# Patient Record
Sex: Male | Born: 1990 | Race: Black or African American | Hispanic: No
Health system: Southern US, Community
[De-identification: ages and names within clinical notes are randomized; demographics above are authoritative.]

## PROBLEM LIST (undated history)

## (undated) DIAGNOSIS — I10 Essential (primary) hypertension: Secondary | ICD-10-CM

## (undated) HISTORY — PX: NO PAST SURGERIES: SHX2092

## (undated) HISTORY — DX: Essential (primary) hypertension: I10

---
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
## 2008-07-31 ENCOUNTER — Emergency Department: Payer: Self-pay | Admitting: Emergency Medicine

## 2012-08-16 ENCOUNTER — Emergency Department: Payer: Self-pay | Admitting: Emergency Medicine

## 2013-08-02 ENCOUNTER — Emergency Department: Payer: Self-pay | Admitting: Emergency Medicine

## 2013-08-02 LAB — COMPREHENSIVE METABOLIC PANEL
Albumin: 3.7 g/dL (ref 3.4–5.0)
Alkaline Phosphatase: 92 U/L (ref 50–136)
BUN: 5 mg/dL — ABNORMAL LOW (ref 7–18)
Bilirubin,Total: 0.3 mg/dL (ref 0.2–1.0)
Calcium, Total: 8.4 mg/dL — ABNORMAL LOW (ref 8.5–10.1)
Chloride: 110 mmol/L — ABNORMAL HIGH (ref 98–107)
Co2: 27 mmol/L (ref 21–32)
Creatinine: 0.75 mg/dL (ref 0.60–1.30)
EGFR (Non-African Amer.): >60
Osmolality: 286 (ref 275–301)
Sodium: 145 mmol/L (ref 136–145)
Total Protein: 7.7 g/dL (ref 6.4–8.2)

## 2013-08-02 LAB — CBC
MCH: 28.6 pg (ref 26.0–34.0)
MCV: 84 fL (ref 80–100)
Platelet: 270 10*3/uL (ref 150–440)
WBC: 9.1 10*3/uL (ref 3.8–10.6)

## 2013-08-02 LAB — URINALYSIS, COMPLETE
Blood: NEGATIVE
Glucose,UR: NEGATIVE mg/dL (ref 0–75)
Ketone: NEGATIVE
Leukocyte Esterase: NEGATIVE
Nitrite: NEGATIVE
Protein: NEGATIVE
Specific Gravity: 1.005 (ref 1.003–1.030)
Squamous Epithelial: <1
WBC UR: NONE SEEN /HPF (ref 0–5)

## 2013-08-02 LAB — DRUG SCREEN, URINE
Barbiturates, Ur Screen: NEGATIVE (ref ?–200)
Cannabinoid 50 Ng, Ur ~~LOC~~: POSITIVE (ref ?–50)
Cocaine Metabolite,Ur ~~LOC~~: NEGATIVE (ref ?–300)
MDMA (Ecstasy)Ur Screen: NEGATIVE (ref ?–500)
Methadone, Ur Screen: NEGATIVE (ref ?–300)
Tricyclic, Ur Screen: NEGATIVE (ref ?–1000)

## 2013-08-02 LAB — ETHANOL: Ethanol %: 0.317 % (ref 0.000–0.080)

## 2013-12-14 ENCOUNTER — Emergency Department: Payer: Self-pay | Admitting: Emergency Medicine

## 2013-12-14 LAB — BASIC METABOLIC PANEL
Anion Gap: 5 — ABNORMAL LOW (ref 7–16)
Co2: 31 mmol/L (ref 21–32)
Creatinine: 0.84 mg/dL (ref 0.60–1.30)
EGFR (African American): >60
EGFR (Non-African Amer.): >60
Glucose: 93 mg/dL (ref 65–99)
Osmolality: 280 (ref 275–301)

## 2013-12-14 LAB — CBC
HCT: 48.3 % (ref 40.0–52.0)
HGB: 15.5 g/dL (ref 13.0–18.0)
MCH: 26.8 pg (ref 26.0–34.0)
RBC: 5.78 10*6/uL (ref 4.40–5.90)
RDW: 14.6 % — ABNORMAL HIGH (ref 11.5–14.5)

## 2015-12-29 ENCOUNTER — Encounter (HOSPITAL_COMMUNITY): Payer: Self-pay | Admitting: Emergency Medicine

## 2015-12-29 ENCOUNTER — Emergency Department (HOSPITAL_COMMUNITY)
Admission: EM | Admit: 2015-12-29 | Discharge: 2015-12-30 | Disposition: A | Discharge status: Home / Self Care | Payer: Self-pay | Attending: Emergency Medicine | Admitting: Emergency Medicine

## 2015-12-29 ENCOUNTER — Emergency Department (HOSPITAL_COMMUNITY): Payer: Self-pay

## 2015-12-29 DIAGNOSIS — S02402B Zygomatic fracture, unspecified, initial encounter for open fracture: Secondary | ICD-10-CM | POA: Insufficient documentation

## 2015-12-29 DIAGNOSIS — F17210 Nicotine dependence, cigarettes, uncomplicated: Secondary | ICD-10-CM | POA: Insufficient documentation

## 2015-12-29 DIAGNOSIS — S61411A Laceration without foreign body of right hand, initial encounter: Secondary | ICD-10-CM | POA: Insufficient documentation

## 2015-12-29 DIAGNOSIS — S0181XA Laceration without foreign body of other part of head, initial encounter: Secondary | ICD-10-CM

## 2015-12-29 DIAGNOSIS — Y9289 Other specified places as the place of occurrence of the external cause: Secondary | ICD-10-CM | POA: Insufficient documentation

## 2015-12-29 DIAGNOSIS — S01411A Laceration without foreign body of right cheek and temporomandibular area, initial encounter: Secondary | ICD-10-CM | POA: Insufficient documentation

## 2015-12-29 DIAGNOSIS — Y9389 Activity, other specified: Secondary | ICD-10-CM | POA: Insufficient documentation

## 2015-12-29 DIAGNOSIS — Z23 Encounter for immunization: Secondary | ICD-10-CM | POA: Insufficient documentation

## 2015-12-29 DIAGNOSIS — F1210 Cannabis abuse, uncomplicated: Secondary | ICD-10-CM | POA: Insufficient documentation

## 2015-12-29 DIAGNOSIS — Y998 Other external cause status: Secondary | ICD-10-CM | POA: Insufficient documentation

## 2015-12-29 LAB — RAPID URINE DRUG SCREEN, HOSP PERFORMED
AMPHETAMINES: NOT DETECTED
BARBITURATES: NOT DETECTED
BENZODIAZEPINES: NOT DETECTED
COCAINE: NOT DETECTED
OPIATES: NOT DETECTED
TETRAHYDROCANNABINOL: POSITIVE — AB

## 2015-12-29 MED ORDER — CEPHALEXIN 500 MG PO CAPS: 500.0000 mg | ORAL_CAPSULE | Freq: Four times a day (QID) | ORAL | Status: DC

## 2015-12-29 MED ORDER — TETANUS-DIPHTH-ACELL PERTUSSIS 5-2.5-18.5 LF-MCG/0.5 IM SUSP
0.5000 mL | Freq: Once | INTRAMUSCULAR | Status: AC
  Administered 2015-12-29: 0.5 mL via INTRAMUSCULAR
  Filled 2015-12-29: qty 0.5

## 2015-12-29 MED ORDER — AMOXICILLIN-POT CLAVULANATE 875-125 MG PO TABS: 1.0000 | ORAL_TABLET | Freq: Two times a day (BID) | ORAL | Status: DC

## 2015-12-29 MED ORDER — HYDROCODONE-ACETAMINOPHEN 5-325 MG PO TABS: 1.0000 | ORAL_TABLET | ORAL | Status: DC | PRN

## 2015-12-29 MED ORDER — OXYCODONE-ACETAMINOPHEN 5-325 MG PO TABS
2.0000 | ORAL_TABLET | Freq: Once | ORAL | Status: AC
  Administered 2015-12-29: 2 via ORAL
  Filled 2015-12-29: qty 2

## 2015-12-29 MED ORDER — LIDOCAINE HCL (PF) 1 % IJ SOLN
30.0000 mL | Freq: Once | INTRAMUSCULAR | Status: AC
  Administered 2015-12-29: 30 mL
  Filled 2015-12-29: qty 30
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
## 2015-12-29 NOTE — ED Notes (Signed)
Pt anxious. Pt continues to come out of room and stop every member of staff that passes by. Pt informed on several occasions to to stay in room and have a seat on the bed.

## 2016-01-01 ENCOUNTER — Ambulatory Visit (INDEPENDENT_AMBULATORY_CARE_PROVIDER_SITE_OTHER): Payer: Self-pay | Admitting: Otolaryngology

## 2016-05-26 ENCOUNTER — Other Ambulatory Visit
Admission: RE | Admit: 2016-05-26 | Discharge: 2016-05-26 | Disposition: A | Discharge status: Home / Self Care | Payer: Self-pay | Attending: Family Medicine | Admitting: Family Medicine
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
## 2020-03-03 DIAGNOSIS — F1010 Alcohol abuse, uncomplicated: Secondary | ICD-10-CM

## 2020-03-03 HISTORY — DX: Alcohol abuse, uncomplicated: F10.10

## 2020-04-25 ENCOUNTER — Emergency Department
Admission: EM | Admit: 2020-04-25 | Discharge: 2020-04-25 | Disposition: A | Discharge status: Home / Self Care | Payer: Self-pay | Attending: Emergency Medicine | Admitting: Emergency Medicine

## 2020-04-25 ENCOUNTER — Encounter: Payer: Self-pay | Admitting: Emergency Medicine

## 2020-04-25 ENCOUNTER — Other Ambulatory Visit: Payer: Self-pay

## 2020-04-25 DIAGNOSIS — K047 Periapical abscess without sinus: Secondary | ICD-10-CM | POA: Insufficient documentation

## 2020-04-25 DIAGNOSIS — F17210 Nicotine dependence, cigarettes, uncomplicated: Secondary | ICD-10-CM | POA: Insufficient documentation

## 2020-04-25 MED ORDER — MAGIC MOUTHWASH W/LIDOCAINE: 5.0000 mL | Freq: Four times a day (QID) | ORAL | 0 refills | Status: DC

## 2020-04-25 MED ORDER — HYDROCODONE-ACETAMINOPHEN 5-325 MG PO TABS
1.0000 | ORAL_TABLET | Freq: Once | ORAL | Status: AC
  Administered 2020-04-25: 1 via ORAL
  Filled 2020-04-25: qty 1

## 2020-04-25 MED ORDER — AMOXICILLIN 500 MG PO CAPS
1000.0000 mg | ORAL_CAPSULE | Freq: Once | ORAL | Status: AC
  Administered 2020-04-25: 1000 mg via ORAL
  Filled 2020-04-25: qty 2

## 2020-04-25 MED ORDER — IBUPROFEN 800 MG PO TABS: 800.0000 mg | ORAL_TABLET | Freq: Three times a day (TID) | ORAL | 0 refills | Status: DC | PRN

## 2020-04-25 MED ORDER — AMOXICILLIN 875 MG PO TABS: 875.0000 mg | ORAL_TABLET | Freq: Two times a day (BID) | ORAL | 0 refills | Status: DC
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
## 2020-04-25 NOTE — ED Provider Notes (Signed)
[facility] Emergency Department Provider Note  ____________________________________________  Time seen: Approximately 10:30 AM  I have reviewed the triage vital signs and the nursing notes.   HISTORY  Chief Complaint Dental Problem    HPI Wesley Doyle is a 29 y.o. male who presents the emergency department complaining of left-sided dental pain.  Patient is having pain to the left upper and lower dentition.  He believes the pain started in the left upper dentition however.  No fevers or chills.  No difficulty breathing or swallowing.  He does not have a dentist.  Patient denies any other complaints at this time.  No alleviating measures prior to arrival.         History reviewed. No pertinent past medical history.  There are no problems to display for this patient.   History reviewed. No pertinent surgical history.  Prior to Admission medications   Not on File    Allergies Patient has no known allergies.  History reviewed. No pertinent family history.  Social History Social History   Tobacco Use  . Smoking status: Current Every Day Smoker    Packs/day: 0.50    Years: 13.00    Pack years: 6.50    Types: Cigarettes  Substance Use Topics  . Alcohol use: Yes    Alcohol/week: 10.0 standard drinks    Types: 10 Shots of liquor per week    Comment: occasuionally  . Drug use: Yes    Types: Marijuana     Review of Systems  Constitutional: No fever/chills Eyes: No visual changes. No discharge ENT: Positive for left-sided dental pain Cardiovascular: no chest pain. Respiratory: no cough. No SOB. Gastrointestinal: No abdominal pain.  No nausea, no vomiting.  No diarrhea.  No constipation. Musculoskeletal: Negative for musculoskeletal pain. Skin: Negative for rash, abrasions, lacerations, ecchymosis. Neurological: Negative for headaches, focal weakness or numbness. 10-point ROS otherwise  negative.  ____________________________________________   PHYSICAL EXAM:  VITAL SIGNS: ED Triage Vitals  Enc Vitals Group     BP 04/25/20 1011 (!) 157/93     Pulse Rate 04/25/20 1011 70     Resp 04/25/20 1011 14     Temp 04/25/20 1011 98.1 F (36.7 C)     Temp Source 04/25/20 1011 Oral     SpO2 04/25/20 1011 99 %     Weight 04/25/20 1003 250 lb (113.4 kg)     Height 04/25/20 1003 5\' 8"  (1.727 m)     Head Circumference --      Peak Flow --      Pain Score 04/25/20 1003 10     Pain Loc --      Pain Edu? --      Excl. in [place]? --      Constitutional: Alert and oriented. Well appearing and in no acute distress. Eyes: Conjunctivae are normal. PERRL. EOMI. Head: Atraumatic. ENT:      Ears:       Nose: No congestion/rhinnorhea.      Mouth/Throat: Mucous membranes are moist.  A few scattered caries, dental erosion to the gumline left upper dentition with surrounding erythema and edema.  No purulent drainage.  No fluctuance with palpation of tongue depressor.  No gross swelling of the cheek.  Palpation reveals no tenderness in the submandibular region.  No erythema or edema of the anterior neck. Neck: No stridor.   Hematological/Lymphatic/Immunilogical: No cervical lymphadenopathy. Cardiovascular: Normal rate, regular rhythm. Normal S1 and S2.  Good peripheral circulation. Respiratory: Normal respiratory effort without  tachypnea or retractions. Lungs CTAB. Good air entry to the bases with no decreased or absent breath sounds. Musculoskeletal: Full range of motion to all extremities. No gross deformities appreciated. Neurologic:  Normal speech and language. No gross focal neurologic deficits are appreciated.  Skin:  Skin is warm, dry and intact. No rash noted. Psychiatric: Mood and affect are normal. Speech and behavior are normal. Patient exhibits appropriate insight and judgement.   ____________________________________________   LABS (all labs ordered are listed, but only  abnormal results are displayed)  Labs Reviewed - No data to display ____________________________________________  EKG   ____________________________________________  RADIOLOGY   No results found.  ____________________________________________    PROCEDURES  Procedure(s) performed:    Procedures    Medications  amoxicillin (AMOXIL) capsule 1,000 mg (has no administration in time range)  HYDROcodone-acetaminophen (NORCO/VICODIN) 5-325 MG per tablet 1 tablet (has no administration in time range)     ____________________________________________   INITIAL IMPRESSION / ASSESSMENT AND PLAN / ED COURSE  Pertinent labs & imaging results that were available during my care of the patient were reviewed by me and considered in my medical decision making (see chart for details).  Review of the [place] CSRS was performed in accordance of the [facility] prior to dispensing any controlled drugs.           Patient's diagnosis is consistent with dental infection.  Patient presented to the emergency department complaining of left-sided dental pain.  Patient has scattered caries identified but does have a tooth that is eroded to the gumline with surrounding erythema and edema.  No evidence of appreciable abscess requiring incision and drainage.  No indication for labs or imaging.. Patient will be discharged home with prescriptions for amoxicillin, Magic mouthwash, 800 mg ibuprofen. Patient is to follow up with primary care or dentist as needed or otherwise directed. Patient is given ED precautions to return to the ED for any worsening or new symptoms.     ____________________________________________  FINAL CLINICAL IMPRESSION(S) / ED DIAGNOSES  Final diagnoses:  Dental infection      NEW MEDICATIONS STARTED DURING THIS VISIT:  ED Discharge Orders    None          This chart was dictated using voice recognition software/Dragon. Despite best efforts to proofread, errors can  occur which can change the meaning. Any change was purely unintentional.    Racheal Patches, PA-C 04/25/20 1036    Minna Antis, MD 04/25/20 1445

## 2020-04-25 NOTE — ED Triage Notes (Signed)
Pt here for left upper dental pain. Does not have a dentist. No fever or drainage. Mild swelling.

## 2020-04-30 ENCOUNTER — Ambulatory Visit: Payer: Self-pay | Admitting: Gerontology

## 2020-04-30 ENCOUNTER — Other Ambulatory Visit: Payer: Self-pay

## 2020-04-30 ENCOUNTER — Encounter: Payer: Self-pay | Admitting: Gerontology

## 2020-04-30 VITALS — BP 150/97 | HR 69 | Temp 97.5°F | Ht 68.0 in | Wt 249.0 lb

## 2020-04-30 DIAGNOSIS — I10 Essential (primary) hypertension: Secondary | ICD-10-CM | POA: Insufficient documentation

## 2020-04-30 DIAGNOSIS — K0889 Other specified disorders of teeth and supporting structures: Secondary | ICD-10-CM | POA: Insufficient documentation

## 2020-04-30 DIAGNOSIS — Z7689 Persons encountering health services in other specified circumstances: Secondary | ICD-10-CM | POA: Insufficient documentation

## 2020-04-30 MED ORDER — CHLORTHALIDONE 25 MG PO TABS: 12.5000 mg | ORAL_TABLET | Freq: Every day | ORAL | 0 refills | Status: DC

## 2020-04-30 NOTE — Patient Instructions (Signed)
   Managing Your Hypertension Hypertension is commonly called high blood pressure. This is when the force of your blood pressing against the walls of your arteries is too strong. Arteries are blood vessels that carry blood from your heart throughout your body. Hypertension forces the heart to work harder to pump blood, and may cause the arteries to become narrow or stiff. Having untreated or uncontrolled hypertension can cause heart attack, stroke, kidney disease, and other problems. What are blood pressure readings? A blood pressure reading consists of a higher number over a lower number. Ideally, your blood pressure should be below 120/80. The first ("top") number is called the systolic pressure. It is a measure of the pressure in your arteries as your heart beats. The second ("bottom") number is called the diastolic pressure. It is a measure of the pressure in your arteries as the heart relaxes. What does my blood pressure reading mean? Blood pressure is classified into four stages. Based on your blood pressure reading, your health care provider may use the following stages to determine what type of treatment you need, if any. Systolic pressure and diastolic pressure are measured in a unit called mm Hg. Normal  Systolic pressure: below 120.  Diastolic pressure: below 80. Elevated  Systolic pressure: 120-129.  Diastolic pressure: below 80. Hypertension stage 1  Systolic pressure: 130-139.  Diastolic pressure: 80-89. Hypertension stage 2  Systolic pressure: 140 or above.  Diastolic pressure: 90 or above. What health risks are associated with hypertension? Managing your hypertension is an important responsibility. Uncontrolled hypertension can lead to:  A heart attack.  A stroke.  A weakened blood vessel (aneurysm).  Heart failure.  Kidney damage.  Eye damage.  Metabolic syndrome.  Memory and concentration problems. What changes can I make to manage my  hypertension? Hypertension can be managed by making lifestyle changes and possibly by taking medicines. Your health care provider will help you make a plan to bring your blood pressure within a normal range. Eating and drinking   Eat a diet that is high in fiber and potassium, and low in salt (sodium), added sugar, and fat. An example eating plan is called the DASH (Dietary Approaches to Stop Hypertension) diet. To eat this way: ? Eat plenty of fresh fruits and vegetables. Try to fill half of your plate at each meal with fruits and vegetables. ? Eat whole grains, such as whole wheat pasta, brown rice, or whole grain bread. Fill about one quarter of your plate with whole grains. ? Eat low-fat diary products. ? Avoid fatty cuts of meat, processed or cured meats, and poultry with skin. Fill about one quarter of your plate with lean proteins such as fish, chicken without skin, beans, eggs, and tofu. ? Avoid premade and processed foods. These tend to be higher in sodium, added sugar, and fat.  Reduce your daily sodium intake. Most people with hypertension should eat less than 1,500 mg of sodium a day.  Limit alcohol intake to no more than 1 drink a day for nonpregnant women and 2 drinks a day for men. One drink equals 12 oz of beer, 5 oz of wine, or 1 oz of hard liquor. Lifestyle  Work with your health care provider to maintain a healthy body weight, or to lose weight. Ask what an ideal weight is for you.  Get at least 30 minutes of exercise that causes your heart to beat faster (aerobic exercise) most days of the week. Activities may include walking, swimming, or biking.    Include exercise to strengthen your muscles (resistance exercise), such as weight lifting, as part of your weekly exercise routine. Try to do these types of exercises for 30 minutes at least 3 days a week.  Do not use any products that contain nicotine or tobacco, such as cigarettes and e-cigarettes. If you need help quitting,  ask your health care provider.  Control any long-term (chronic) conditions you have, such as high cholesterol or diabetes. Monitoring  Monitor your blood pressure at home as told by your health care provider. Your personal target blood pressure may vary depending on your medical conditions, your age, and other factors.  Have your blood pressure checked regularly, as often as told by your health care provider. Working with your health care provider  Review all the medicines you take with your health care provider because there may be side effects or interactions.  Talk with your health care provider about your diet, exercise habits, and other lifestyle factors that may be contributing to hypertension.  Visit your health care provider regularly. Your health care provider can help you create and adjust your plan for managing hypertension. Will I need medicine to control my blood pressure? Your health care provider may prescribe medicine if lifestyle changes are not enough to get your blood pressure under control, and if:  Your systolic blood pressure is 130 or higher.  Your diastolic blood pressure is 80 or higher. Take medicines only as told by your health care provider. Follow the directions carefully. Blood pressure medicines must be taken as prescribed. The medicine does not work as well when you skip doses. Skipping doses also puts you at risk for problems. Contact a health care provider if:  You think you are having a reaction to medicines you have taken.  You have repeated (recurrent) headaches.  You feel dizzy.  You have swelling in your ankles.  You have trouble with your vision. Get help right away if:  You develop a severe headache or confusion.  You have unusual weakness or numbness, or you feel faint.  You have severe pain in your chest or abdomen.  You vomit repeatedly.  You have trouble breathing. Summary  Hypertension is when the force of blood pumping  through your arteries is too strong. If this condition is not controlled, it may put you at risk for serious complications.  Your personal target blood pressure may vary depending on your medical conditions, your age, and other factors. For most people, a normal blood pressure is less than 120/80.  Hypertension is managed by lifestyle changes, medicines, or both. Lifestyle changes include weight loss, eating a healthy, low-sodium diet, exercising more, and limiting alcohol. This information is not intended to replace advice given to you by your health care provider. Make sure you discuss any questions you have with your health care provider. Document Revised: 03/30/2019 Document Reviewed: 11/03/2016 Elsevier Patient Education  2020 Elsevier Inc.  

## 2020-04-30 NOTE — Progress Notes (Signed)
Patient ID: Wesley Doyle, male   DOB: Oct 19, 1991, 29 y.o.   MRN: [id number]  Chief Complaint  Patient presents with  . Establish Care    pt notes persistent tooth ache as well    HPI Wesley Doyle is a 29 y.o. male who presents to establish care and evaluation of his chronic conditions. He was seen at the ED on 04/25/2020 for Dental infection and was treated with Amoxicillin,Vicodin and Magic mouth wash. He has cavity to his left lower mandibular tooth. He reports that he experiences mild discomfort and requests dental referral. He has a history of hypertension and has not been on any antihypertensive medication. [facility] blood [facility] and states that the SBP is usually in the mid 140's to 150 and DBP in the 90's. He smokes 1/2 pack of cigarette daily and denies the desire to quit. Overall, he states that he's doing well and offers no further complaint.   Past Medical History:  Diagnosis Date  . Alcohol abuse 03/03/2020  . Hypertension     No past surgical history on file.  Family History  Problem Relation Age of Onset  . Diabetes Father     Social History Social History   Tobacco Use  . Smoking status: Current Every Day Smoker    Packs/day: 0.50    Years: 15.00    Pack years: 7.50    Types: Cigarettes  . Smokeless tobacco: Never Used  Substance Use Topics  . Alcohol use: Not Currently    Alcohol/week: 3.0 standard drinks    Types: 3 Cans of beer per week    Comment: occasuionally  . Drug use: Not Currently    Types: Marijuana    No Known Allergies  Current Outpatient Medications  Medication Sig Dispense Refill  . amoxicillin (AMOXIL) 875 MG tablet Take 1 tablet (875 mg total) by mouth 2 (two) times daily. 14 tablet 0  . ibuprofen (ADVIL) 800 MG tablet Take 1 tablet (800 mg total) by mouth every 8 (eight) hours as needed. 30 tablet 0  . magic mouthwash w/lidocaine SOLN Take 5 mLs by mouth 4 (four) times daily. 240 mL 0  .  chlorthalidone (HYGROTON) 25 MG tablet Take 0.5 tablets (12.5 mg total) by mouth daily. 30 tablet 0   No current facility-administered medications for this visit.    Review of Systems Review of Systems  Constitutional: Negative.   HENT: Positive for dental problem.   Eyes: Negative.   Respiratory: Negative.   Cardiovascular: Negative.   Gastrointestinal: Negative.   Endocrine: Negative.   Genitourinary: Negative.   Musculoskeletal: Negative.   Skin: Negative.   Neurological: Negative.   Hematological: Negative.   Psychiatric/Behavioral: Negative.     Blood pressure (!) 150/97, pulse 69, temperature (!) 97.5 F (36.4 C), height _0  (1.727 m), weight 249 lb (112.9 kg), SpO2 95 %.  Physical Exam Physical Exam HENT:     Head: Normocephalic and atraumatic.     Nose:     Comments: Deferred per covid protocol    Mouth/Throat:     Comments: Deferred per covid protocol Eyes:     Extraocular Movements: Extraocular movements intact.     Pupils: Pupils are equal, round, and reactive to light.  Cardiovascular:     Rate and Rhythm: Normal rate and regular rhythm.     Pulses: Normal pulses.     Heart sounds: Normal heart sounds.  Pulmonary:     Effort: Pulmonary effort is normal.  Breath sounds: Normal breath sounds.  Abdominal:     General: Abdomen is flat. Bowel sounds are normal.     Palpations: Abdomen is soft.  Genitourinary:    Comments: Deferred per patient. Musculoskeletal:        General: Normal range of motion.     Cervical back: Normal range of motion.  Skin:    General: Skin is warm and dry.  Neurological:     General: No focal deficit present.     Mental Status: He is alert and oriented to person, place, and time. Mental status is at baseline.  Psychiatric:        Mood and Affect: Mood normal.        Behavior: Behavior normal.        Thought Content: Thought content normal.        Judgment: Judgment normal.     Data Reviewed Lab and past medical  history was reviewed.  Assessment and Plan  1. Encounter to establish care Routine labs will be checked. - CBC w/Diff; Future - Comp Met (CMET); Future - Lipid panel; Future - HgB A1c; Future - Urinalysis; Future - Urinalysis - HgB A1c - Lipid panel - Comp Met (CMET) - CBC w/Diff  2. Essential hypertension - His blood pressure is elevated and he will start on chlorthalidone 12.5 mg daily. He was educated on medication side effects and advised to notify clinic. - He was advised to check his blood pressure daily, record and bring log to follow up appointment. He was encouraged on smoking cessation, advised to continue on DASH diet and exercise as tolerated. - chlorthalidone (HYGROTON) 25 MG tablet; Take 0.5 tablets (12.5 mg total) by mouth daily.  Dispense: 30 tablet; Refill: 0  3. Pain, dental - He was encouraged to complete dental application for Dental referral.    Wesley Doyle 04/30/2020, 8:28 PM

## 2020-05-01 LAB — CBC WITH DIFFERENTIAL/PLATELET
Basophils Absolute: 0.1 10*3/uL (ref 0.0–0.2)
Basos: 1 %
EOS (ABSOLUTE): 0.1 10*3/uL (ref 0.0–0.4)
Eos: 1 %
Hematocrit: 44.4 % (ref 37.5–51.0)
Hemoglobin: 15 g/dL (ref 13.0–17.7)
Immature Grans (Abs): 0.1 10*3/uL (ref 0.0–0.1)
Immature Granulocytes: 1 %
Lymphocytes Absolute: 3.1 10*3/uL (ref 0.7–3.1)
Lymphs: 36 %
MCH: 27 pg (ref 26.6–33.0)
MCHC: 33.8 g/dL (ref 31.5–35.7)
MCV: 80 fL (ref 79–97)
Monocytes Absolute: 0.7 10*3/uL (ref 0.1–0.9)
Monocytes: 9 %
Neutrophils Absolute: 4.5 10*3/uL (ref 1.4–7.0)
Neutrophils: 52 %
Platelets: 305 10*3/uL (ref 150–450)
RBC: 5.56 x10E6/uL (ref 4.14–5.80)
RDW: 14 % (ref 11.6–15.4)
WBC: 8.6 10*3/uL (ref 3.4–10.8)

## 2020-05-01 LAB — LIPID PANEL
Chol/HDL Ratio: 3.7 ratio (ref 0.0–5.0)
Cholesterol, Total: 206 mg/dL — ABNORMAL HIGH (ref 100–199)
HDL: 55 mg/dL (ref >39)
LDL Chol Calc (NIH): 132 mg/dL — ABNORMAL HIGH (ref 0–99)
Triglycerides: 105 mg/dL (ref 0–149)
VLDL Cholesterol Cal: 19 mg/dL (ref 5–40)

## 2020-05-01 LAB — URINALYSIS
Bilirubin, UA: NEGATIVE
Glucose, UA: NEGATIVE
Leukocytes,UA: NEGATIVE
Nitrite, UA: NEGATIVE
Protein,UA: NEGATIVE
RBC, UA: NEGATIVE
Specific Gravity, UA: >=1.03 — AB (ref 1.005–1.030)
Urobilinogen, Ur: 0.2 mg/dL (ref 0.2–1.0)
pH, UA: 5 (ref 5.0–7.5)

## 2020-05-01 LAB — COMPREHENSIVE METABOLIC PANEL
ALT: 47 IU/L — ABNORMAL HIGH (ref 0–44)
AST: 27 IU/L (ref 0–40)
Albumin/Globulin Ratio: 1.6 (ref 1.2–2.2)
Albumin: 4.5 g/dL (ref 4.1–5.2)
Alkaline Phosphatase: 68 IU/L (ref 39–117)
BUN/Creatinine Ratio: 19 (ref 9–20)
BUN: 15 mg/dL (ref 6–20)
Bilirubin Total: 0.2 mg/dL (ref 0.0–1.2)
CO2: 21 mmol/L (ref 20–29)
Calcium: 9.3 mg/dL (ref 8.7–10.2)
Chloride: 104 mmol/L (ref 96–106)
Creatinine, Ser: 0.8 mg/dL (ref 0.76–1.27)
GFR calc Af Amer: 139 mL/min/{1.73_m2} (ref >59)
GFR calc non Af Amer: 120 mL/min/{1.73_m2} (ref >59)
Globulin, Total: 2.9 g/dL (ref 1.5–4.5)
Glucose: 88 mg/dL (ref 65–99)
Potassium: 4.4 mmol/L (ref 3.5–5.2)
Sodium: 142 mmol/L (ref 134–144)
Total Protein: 7.4 g/dL (ref 6.0–8.5)

## 2020-05-01 LAB — HEMOGLOBIN A1C
Est. average glucose Bld gHb Est-mCnc: 108 mg/dL
Hgb A1c MFr Bld: 5.4 % (ref 4.8–5.6)

## 2020-05-29 ENCOUNTER — Ambulatory Visit: Payer: Self-pay | Admitting: Gerontology

## 2020-07-24 ENCOUNTER — Emergency Department
Admission: EM | Admit: 2020-07-24 | Discharge: 2020-07-24 | Disposition: A | Discharge status: Home / Self Care | Payer: Self-pay | Attending: Student in an Organized Health Care Education/Training Program | Admitting: Student in an Organized Health Care Education/Training Program

## 2020-07-24 DIAGNOSIS — R451 Restlessness and agitation: Secondary | ICD-10-CM | POA: Insufficient documentation

## 2020-07-24 DIAGNOSIS — F1010 Alcohol abuse, uncomplicated: Secondary | ICD-10-CM

## 2020-07-24 DIAGNOSIS — I10 Essential (primary) hypertension: Secondary | ICD-10-CM | POA: Insufficient documentation

## 2020-07-24 DIAGNOSIS — F17210 Nicotine dependence, cigarettes, uncomplicated: Secondary | ICD-10-CM | POA: Insufficient documentation

## 2020-07-24 DIAGNOSIS — F10121 Alcohol abuse with intoxication delirium: Secondary | ICD-10-CM | POA: Insufficient documentation

## 2020-07-24 DIAGNOSIS — F10921 Alcohol use, unspecified with intoxication delirium: Secondary | ICD-10-CM

## 2020-07-24 DIAGNOSIS — F10929 Alcohol use, unspecified with intoxication, unspecified: Secondary | ICD-10-CM | POA: Diagnosis present

## 2020-07-24 DIAGNOSIS — F10920 Alcohol use, unspecified with intoxication, uncomplicated: Secondary | ICD-10-CM

## 2020-07-24 MED ORDER — LORAZEPAM 2 MG/ML IJ SOLN
INTRAMUSCULAR | Status: AC
  Filled 2020-07-24: qty 1

## 2020-07-24 MED ORDER — DIPHENHYDRAMINE HCL 50 MG/ML IJ SOLN
INTRAMUSCULAR | Status: AC
  Filled 2020-07-24: qty 1

## 2020-07-24 MED ORDER — DROPERIDOL 2.5 MG/ML IJ SOLN
10.0000 mg | Freq: Once | INTRAMUSCULAR | Status: AC
  Administered 2020-07-24: 10 mg via INTRAMUSCULAR
  Filled 2020-07-24: qty 4

## 2020-07-24 MED ORDER — HALOPERIDOL LACTATE 5 MG/ML IJ SOLN
INTRAMUSCULAR | Status: AC
  Filled 2020-07-24: qty 1
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
## 2020-07-24 NOTE — ED Notes (Signed)
Pt now in bed laying down and asleep

## 2020-07-24 NOTE — Discharge Instructions (Signed)
[facility] [address]  [phone]  [facility]
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
## 2020-07-24 NOTE — ED Notes (Signed)
Rivka Barbara, Diplomatic Services operational officer informed this RN that patient had left room 9. She asked him to go back to his room but patient kept walking. Security, [employer], charge RN, [facility] and Facilities manager made aware.
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
## 2020-07-24 NOTE — ED Notes (Signed)
Dr. Manson Passey states to let pt rest and not attempt to do anything for pt at this time
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
## 2020-08-14 ENCOUNTER — Telehealth: Payer: Self-pay | Admitting: Pharmacist

## 2020-08-14 NOTE — Telephone Encounter (Signed)
Patient failed to provide requested 2021 financial documentation. No additional medication assistance will be provided by [facility] without the required proof of income documentation.  Marquette Saa Administrative Assistant [facility]

## 2020-08-19 ENCOUNTER — Telehealth: Payer: Self-pay | Admitting: Pharmacist

## 2020-08-19 NOTE — Telephone Encounter (Signed)
Patient failed to provide requested 2021 financial documentation. No additional medication assistance will be provided by [facility] without the required proof of income documentation. Patient is no longer with [facility] and there is no additional contact information. Mar[facility] Saa Administrative Assistant [facility]

## 2021-07-16 ENCOUNTER — Emergency Department
Admission: EM | Admit: 2021-07-16 | Discharge: 2021-07-16 | Disposition: A | Discharge status: Home / Self Care | Payer: Self-pay | Attending: Emergency Medicine | Admitting: Emergency Medicine

## 2021-07-16 ENCOUNTER — Emergency Department: Payer: Self-pay

## 2021-07-16 ENCOUNTER — Other Ambulatory Visit: Payer: Self-pay

## 2021-07-16 DIAGNOSIS — F1010 Alcohol abuse, uncomplicated: Secondary | ICD-10-CM | POA: Insufficient documentation

## 2021-07-16 DIAGNOSIS — S61412A Laceration without foreign body of left hand, initial encounter: Secondary | ICD-10-CM | POA: Insufficient documentation

## 2021-07-16 DIAGNOSIS — R4781 Slurred speech: Secondary | ICD-10-CM | POA: Insufficient documentation

## 2021-07-16 DIAGNOSIS — Z23 Encounter for immunization: Secondary | ICD-10-CM | POA: Insufficient documentation

## 2021-07-16 DIAGNOSIS — I10 Essential (primary) hypertension: Secondary | ICD-10-CM | POA: Insufficient documentation

## 2021-07-16 DIAGNOSIS — F17210 Nicotine dependence, cigarettes, uncomplicated: Secondary | ICD-10-CM | POA: Insufficient documentation

## 2021-07-16 DIAGNOSIS — S41111A Laceration without foreign body of right upper arm, initial encounter: Secondary | ICD-10-CM | POA: Insufficient documentation

## 2021-07-16 MED ORDER — LIDOCAINE-EPINEPHRINE 2 %-1:100000 IJ SOLN
20.0000 mL | Freq: Once | INTRAMUSCULAR | Status: AC
  Administered 2021-07-16: 20 mL via INTRADERMAL

## 2021-07-16 MED ORDER — TETANUS-DIPHTH-ACELL PERTUSSIS 5-2.5-18.5 LF-MCG/0.5 IM SUSY
0.5000 mL | PREFILLED_SYRINGE | Freq: Once | INTRAMUSCULAR | Status: AC
  Administered 2021-07-16: 0.5 mL via INTRAMUSCULAR
  Filled 2021-07-16: qty 0.5

## 2021-07-16 NOTE — Discharge Instructions (Addendum)
For your wounds:  Apply antibiotic ointment over the sutures 2-3 x daily until healed  No swimming until sutures removed  Follow-up here or with your primary in 10-14 days for suture removal  Monitor for any redness, drainage, foul odor, or other concerning symptoms for infection
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
## 2021-07-16 NOTE — ED Provider Notes (Signed)
LACERATION REPAIR Performed by: Faythe Ghee Authorized by: Faythe Ghee Consent: Verbal consent obtained. Risks and benefits: risks, benefits and alternatives were discussed Consent given by: patient Patient identity confirmed: provided demographic data Prepped and Draped in normal sterile fashion Wound explored  Laceration Location: Right upper arm  Laceration Length: 13 cm  No Foreign Bodies seen or palpated  Anesthesia: local infiltration  Local anesthetic: lidocaine 2% with epinephrine  Anesthetic total: 10 ml  Irrigation method: syringe Amount of cleaning: standard  Skin closure: Close  Number of sutures: 11  Technique: Simple  No foreign body, muscle injury, or tendon injury noted, no vascular injury  Patient tolerance: Patient tolerated the procedure well with no immediate complications.    Faythe Ghee, PA-C 07/16/21 [id number]    Shaune Pollack, MD 07/16/21 2006

## 2021-07-16 NOTE — ED Notes (Signed)
Patient now awake and alert, provided with food tray and beverage. Also provided with paper scrub top and bath wipes per request. MD aware.
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
## 2021-07-23 ENCOUNTER — Other Ambulatory Visit: Payer: Self-pay

## 2021-07-23 ENCOUNTER — Emergency Department
Admission: EM | Admit: 2021-07-23 | Discharge: 2021-07-23 | Disposition: A | Discharge status: Home / Self Care | Payer: Self-pay | Attending: Emergency Medicine | Admitting: Emergency Medicine

## 2021-07-23 ENCOUNTER — Encounter: Payer: Self-pay | Admitting: Emergency Medicine

## 2021-07-23 DIAGNOSIS — S61412D Laceration without foreign body of left hand, subsequent encounter: Secondary | ICD-10-CM | POA: Insufficient documentation

## 2021-07-23 DIAGNOSIS — S41111D Laceration without foreign body of right upper arm, subsequent encounter: Secondary | ICD-10-CM | POA: Insufficient documentation

## 2021-07-23 DIAGNOSIS — X58XXXD Exposure to other specified factors, subsequent encounter: Secondary | ICD-10-CM | POA: Insufficient documentation

## 2021-07-23 DIAGNOSIS — Z4802 Encounter for removal of sutures: Secondary | ICD-10-CM | POA: Insufficient documentation

## 2021-07-23 DIAGNOSIS — F17210 Nicotine dependence, cigarettes, uncomplicated: Secondary | ICD-10-CM | POA: Insufficient documentation

## 2021-07-23 DIAGNOSIS — I10 Essential (primary) hypertension: Secondary | ICD-10-CM | POA: Insufficient documentation

## 2021-07-23 NOTE — ED Triage Notes (Addendum)
Pt comes into the ED via POV c/o suture removal from the right bicep and left hand.  Pt denies any complaints.

## 2021-07-23 NOTE — ED Provider Notes (Signed)
[facility] Emergency Department Provider Note  ____________________________________________   Event Date/Time   First MD Initiated Contact with Patient 07/23/21 1411     (approximate)  I have reviewed the triage vital signs and the nursing notes.   HISTORY  Chief Complaint Suture / Staple Removal   HPI Wesley Doyle is a 30 y.o. male who presents to the ER for evaluation of a suture removal. He denies any drainage, fever, or complications with wounds.         Past Medical History:  Diagnosis Date   Alcohol abuse 03/03/2020   Hypertension     Patient Active Problem List   Diagnosis Date Noted   Alcohol abuse 07/24/2020   Alcohol intoxication (HCC) 07/24/2020   Encounter to establish care 04/30/2020   Essential hypertension 04/30/2020   Pain, dental 04/30/2020    History reviewed. No pertinent surgical history.  Prior to Admission medications   Not on File    Allergies Patient has no known allergies.  Family History  Problem Relation Age of Onset   Diabetes Father     Social History Social History   Tobacco Use   Smoking status: Every Day    Packs/day: 0.50    Years: 15.00    Pack years: 7.50    Types: Cigarettes   Smokeless tobacco: Never  Vaping Use   Vaping Use: Never used  Substance Use Topics   Alcohol use: Yes    Alcohol/week: 3.0 standard drinks    Types: 3 Cans of beer per week    Comment: occasuionally   Drug use: Not Currently    Types: Marijuana    Review of Systems Constitutional: No fever/chills Eyes: No visual changes. ENT: No sore throat. Cardiovascular: Denies chest pain. Respiratory: Denies shortness of breath. Gastrointestinal: No abdominal pain.  No nausea, no vomiting.  No diarrhea.  No constipation. Genitourinary: Negative for dysuria. Musculoskeletal: + left hand sutures, + right biceps region Skin: Negative for rash. Neurological: Negative for headaches, focal weakness or  numbness.  ____________________________________________   PHYSICAL EXAM:  VITAL SIGNS: ED Triage Vitals  Enc Vitals Group     BP 07/23/21 1436 136/86     Pulse Rate 07/23/21 1436 79     Resp 07/23/21 1436 18     Temp 07/23/21 1436 98.7 F (37.1 C)     Temp Source 07/23/21 1436 Oral     SpO2 07/23/21 1436 100 %     Weight 07/23/21 1338 248 lb 14.4 oz (112.9 kg)     Height 07/23/21 1338 5\' 8"  (1.727 m)     Head Circumference --      Peak Flow --      Pain Score 07/23/21 1338 0     Pain Loc --      Pain Edu? --      Excl. in GC? --    Constitutional: Alert and oriented. Well appearing and in no acute distress. Eyes: Conjunctivae are normal. PERRL. EOMI. Head: Atraumatic. Nose: No congestion/rhinnorhea. Neck: No stridor.   Musculoskeletal: Mild soft tissue swelling of the left hand.  Full range of motion of all digits without difficulty.  Radial pulse 2+.  Wound is clean with no oozing or drainage.  Right biceps shows a healing incision with sutures in place, no drainage.  Full range of motion of the right elbow without difficulty. Neurologic:  Normal speech and language. No gross focal neurologic deficits are appreciated. No gait instability. Skin:  Skin is warm, dry  and intact except for sutured wounds as above. Psychiatric: Mood and affect are normal. Speech and behavior are normal.  ____________________________________________   PROCEDURES  Procedure(s) performed (including Critical Care):  Procedures  SUTURE REMOVAL Performed by: Lucy Chris  Consent: Verbal consent obtained. Patient identity confirmed: provided demographic data Time out: Immediately prior to procedure a "time out" was called to verify the correct patient, procedure, equipment, support staff and site/side marked as required.  Location details: right biceps  Wound Appearance: clean  Sutures/Staples Removed: 10  Facility: sutures placed in this facility Patient tolerance: Patient  tolerated the procedure well with no immediate complications. 1 suture remains in place at center of laceration given concern the wound may open. Advised this stay an additional 2-3 days and have reexamined by PCP or return here for removal   SUTURE REMOVAL Performed by: Lucy Chris  Consent: Verbal consent obtained. Patient identity confirmed: provided demographic data Time out: Immediately prior to procedure a "time out" was called to verify the correct patient, procedure, equipment, support staff and site/side marked as required.  Location details: left hand  Wound Appearance: clean  Sutures/Staples Removed: 7  Facility: sutures placed in this facility Patient tolerance: Patient tolerated the procedure well with no immediate complications.    ____________________________________________   INITIAL IMPRESSION / ASSESSMENT AND PLAN / ED COURSE  As part of my medical decision making, I reviewed the following data within the electronic MEDICAL RECORD NUMBER Nursing notes reviewed and incorporated and Notes from prior ED visits        Patient is a 30 year old male who presents to the emergency department for suture removal that were placed 7 days ago to 2 lacerations one at the left hand and one in the right bicep.  See HPI for further details.  In triage patient has normal vital signs.  Physical exam as above.  Wounds appear clean, no drainage noted.  There is some soft tissue swelling around the left hand, but with no erythema warmth or signs of infection.  Sutures in the left hand were removed without difficulty.  Sutures of the right bicep were removed except for 1 in the center given the appearance that the wound may reopen.  Patient was advised to leave this 1 remaining for an additional 2 to 3 days and have repeat evaluation and to determine if it was ready for removal at that time.  Patient was advised to continue wound care and keep these clean and covered when working.  Patient  is agreement with plan, stable this time for outpatient follow-up.      ____________________________________________   FINAL CLINICAL IMPRESSION(S) / ED DIAGNOSES  Final diagnoses:  Visit for suture removal     ED Discharge Orders     None        Note:  This document was prepared using Dragon voice recognition software and may include unintentional dictation errors.    Lucy Chris, PA 07/23/21 2000    Delton Prairie, MD 07/23/21 2049

## 2021-07-23 NOTE — Discharge Instructions (Addendum)
Please keep wound dry and covered until it is nonhealing.  You may follow-up with PCP or return to the ER if necessary for removal of the final suture in the right bicep.  Return to the ER if you develop any drainage, redness, fever or other signs of infection.  Otherwise, you may follow-up with your PCP as needed.

## 2021-08-02 ENCOUNTER — Emergency Department
Admission: EM | Admit: 2021-08-02 | Discharge: 2021-08-02 | Disposition: A | Discharge status: Home / Self Care | Payer: Self-pay | Attending: Emergency Medicine | Admitting: Emergency Medicine

## 2021-08-02 ENCOUNTER — Other Ambulatory Visit: Payer: Self-pay

## 2021-08-02 ENCOUNTER — Emergency Department: Payer: Self-pay

## 2021-08-02 DIAGNOSIS — Y92017 Garden or yard in single-family (private) house as the place of occurrence of the external cause: Secondary | ICD-10-CM | POA: Insufficient documentation

## 2021-08-02 DIAGNOSIS — F10929 Alcohol use, unspecified with intoxication, unspecified: Secondary | ICD-10-CM | POA: Insufficient documentation

## 2021-08-02 DIAGNOSIS — I10 Essential (primary) hypertension: Secondary | ICD-10-CM | POA: Insufficient documentation

## 2021-08-02 DIAGNOSIS — Y908 Blood alcohol level of 240 mg/100 ml or more: Secondary | ICD-10-CM | POA: Insufficient documentation

## 2021-08-02 DIAGNOSIS — S0181XA Laceration without foreign body of other part of head, initial encounter: Secondary | ICD-10-CM | POA: Insufficient documentation

## 2021-08-02 DIAGNOSIS — F17210 Nicotine dependence, cigarettes, uncomplicated: Secondary | ICD-10-CM | POA: Insufficient documentation

## 2021-08-02 LAB — ETHANOL: Alcohol, Ethyl (B): 378 mg/dL (ref <10)

## 2021-08-02 LAB — BASIC METABOLIC PANEL
Anion gap: 13 (ref 5–15)
BUN: <5 mg/dL — ABNORMAL LOW (ref 6–20)
CO2: 25 mmol/L (ref 22–32)
Calcium: 8.3 mg/dL — ABNORMAL LOW (ref 8.9–10.3)
Chloride: 106 mmol/L (ref 98–111)
Creatinine, Ser: 0.69 mg/dL (ref 0.61–1.24)
GFR, Estimated: >60 mL/min (ref >60)
Glucose, Bld: 106 mg/dL — ABNORMAL HIGH (ref 70–99)
Potassium: 3.1 mmol/L — ABNORMAL LOW (ref 3.5–5.1)
Sodium: 144 mmol/L (ref 135–145)

## 2021-08-02 LAB — CBC WITH DIFFERENTIAL/PLATELET
Abs Immature Granulocytes: 0.06 10*3/uL (ref 0.00–0.07)
Basophils Absolute: 0.1 10*3/uL (ref 0.0–0.1)
Basophils Relative: 1 %
Eosinophils Absolute: 0 10*3/uL (ref 0.0–0.5)
Eosinophils Relative: 0 %
HCT: 40 % (ref 39.0–52.0)
Hemoglobin: 13.3 g/dL (ref 13.0–17.0)
Immature Granulocytes: 1 %
Lymphocytes Relative: 31 %
Lymphs Abs: 3.3 10*3/uL (ref 0.7–4.0)
MCH: 29.1 pg (ref 26.0–34.0)
MCHC: 33.3 g/dL (ref 30.0–36.0)
MCV: 87.5 fL (ref 80.0–100.0)
Monocytes Absolute: 0.8 10*3/uL (ref 0.1–1.0)
Monocytes Relative: 8 %
Neutro Abs: 6.2 10*3/uL (ref 1.7–7.7)
Neutrophils Relative %: 59 %
Platelets: 372 10*3/uL (ref 150–400)
RBC: 4.57 MIL/uL (ref 4.22–5.81)
RDW: 14.7 % (ref 11.5–15.5)
WBC: 10.4 10*3/uL (ref 4.0–10.5)
nRBC: 0.2 % (ref 0.0–0.2)

## 2021-08-02 MED ORDER — LIDOCAINE-PRILOCAINE 2.5-2.5 % EX CREA
TOPICAL_CREAM | Freq: Once | CUTANEOUS | Status: AC
  Filled 2021-08-02: qty 5

## 2021-08-02 MED ORDER — LIDOCAINE HCL (PF) 1 % IJ SOLN
5.0000 mL | Freq: Once | INTRAMUSCULAR | Status: DC
  Filled 2021-08-02: qty 5

## 2021-08-02 NOTE — ED Notes (Signed)
Pt called grandmother and states she is coming. Pt given blue shirt to go home in. Pt refuses wheelchair and is ambulatory to outside ER lobby. Steady, NAD. Given d/c paperwork.

## 2021-08-02 NOTE — ED Notes (Signed)
Dr. Don Perking informed of patient's ethanol level of 387. No new verbal orders received.
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
## 2021-08-02 NOTE — ED Notes (Signed)
Pt walked to bathroom independently and was having normal conversation. Aware that he is at the hospital. Pt asked for IV to be taken out.
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
## 2021-10-24 ENCOUNTER — Emergency Department
Admission: EM | Admit: 2021-10-24 | Discharge: 2021-10-24 | Disposition: A | Discharge status: Home / Self Care | Payer: Self-pay | Attending: Emergency Medicine | Admitting: Emergency Medicine

## 2021-10-24 ENCOUNTER — Emergency Department: Payer: Self-pay

## 2021-10-24 ENCOUNTER — Encounter: Payer: Self-pay | Admitting: Emergency Medicine

## 2021-10-24 ENCOUNTER — Other Ambulatory Visit: Payer: Self-pay

## 2021-10-24 DIAGNOSIS — F17210 Nicotine dependence, cigarettes, uncomplicated: Secondary | ICD-10-CM | POA: Insufficient documentation

## 2021-10-24 DIAGNOSIS — F10920 Alcohol use, unspecified with intoxication, uncomplicated: Secondary | ICD-10-CM

## 2021-10-24 DIAGNOSIS — F10120 Alcohol abuse with intoxication, uncomplicated: Secondary | ICD-10-CM | POA: Insufficient documentation

## 2021-10-24 DIAGNOSIS — I10 Essential (primary) hypertension: Secondary | ICD-10-CM | POA: Insufficient documentation

## 2021-10-24 NOTE — ED Notes (Signed)
Electronic key pad not available- pt gives verbal consent for discharge at this time.

## 2021-10-24 NOTE — ED Notes (Signed)
CT called per provider request.

## 2021-10-24 NOTE — ED Notes (Signed)
Pt taken to CT at this time. Pt is AOX4, NAD noted.
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
## 2021-10-24 NOTE — ED Notes (Signed)
Pt is AOX4, NAD noted. Pt is ambulatory without obvious difficulty.

## 2021-10-24 NOTE — ED Notes (Signed)
Pt states he wants to go, pt educated on discharge requirements and that MD is waiting for CT results. Pt verbalizes understanding but states he still wants to leave. EDP speaking with pt. Pt phone no.- [phone]

## 2021-10-24 NOTE — ED Provider Notes (Signed)
[facility]  ____________________________________________   Event Date/Time   First MD Initiated Contact with Patient 10/24/21 [phone]     (approximate)  I have reviewed the triage vital signs and the nursing notes.   HISTORY  Chief Complaint Alcohol Intoxication    HPI Wesley Doyle is a 30 y.o. male with past medical history of alcohol use disorder who presents with altered mental status.  Per EMS he was found on the side of the road.  Did admit to using EtOH, frequently falling asleep.  Apparently he is known to EMS for frequent presentations for intoxication with ETOH.  Patient does admit to drinking alcohol.  He cannot tell me whether he was drinking beer or liquor or how much.  No other drug use.  He denies any pain.  History otherwise difficult secondary to the patient's altered mental status.         Past Medical History:  Diagnosis Date   Alcohol abuse 03/03/2020   Hypertension     Patient Active Problem List   Diagnosis Date Noted   Alcohol abuse 07/24/2020   Alcohol intoxication (HCC) 07/24/2020   Encounter to establish care 04/30/2020   Essential hypertension 04/30/2020   Pain, dental 04/30/2020    History reviewed. No pertinent surgical history.  Prior to Admission medications   Not on File    Allergies Patient has no known allergies.  Family History  Problem Relation Age of Onset   Diabetes Father     Social History Social History   Tobacco Use   Smoking status: Every Day    Packs/day: 0.50    Years: 15.00    Pack years: 7.50    Types: Cigarettes   Smokeless tobacco: Never  Vaping Use   Vaping Use: Never used  Substance Use Topics   Alcohol use: Yes    Alcohol/week: 3.0 standard drinks    Types: 3 Cans of beer per week    Comment: occasuionally   Drug use: Not Currently    Types: Marijuana    Review of Systems   Review of Systems  Unable to perform ROS: Mental status change   Physical Exam Updated  Vital Signs BP (!) 165/101   Pulse 84   Temp 97.6 F (36.4 C) (Axillary)   Resp 16   SpO2 96%   Physical Exam Vitals and nursing note reviewed.  Constitutional:      General: He is not in acute distress.    Appearance: Normal appearance.  HENT:     Head: Normocephalic and atraumatic.  Eyes:     General: No scleral icterus.    Conjunctiva/sclera: Conjunctivae normal.  Cardiovascular:     Rate and Rhythm: Normal rate and regular rhythm.  Pulmonary:     Effort: Pulmonary effort is normal. No respiratory distress.     Breath sounds: Normal breath sounds. No wheezing.  Abdominal:     General: Abdomen is flat. There is no distension.     Palpations: Abdomen is soft.     Tenderness: There is no abdominal tenderness.  Musculoskeletal:        General: No deformity or signs of injury.     Cervical back: Normal range of motion.  Skin:    Coloration: Skin is not jaundiced or pale.  Neurological:     Mental Status: He is alert.     Comments: Patient is somewhat dysarthric, appears intoxicated, he is alert however to person and place Simple yes and no questions, sitting up  in the stretcher without distress  Psychiatric:        Mood and Affect: Mood normal.        Behavior: Behavior normal.     LABS (all labs ordered are listed, but only abnormal results are displayed)  Labs Reviewed  CBG MONITORING, ED   ____________________________________________  EKG  N/a ____________________________________________  RADIOLOGY I, Randol Kern, personally viewed and evaluated these images (plain radiographs) as part of my medical decision making, as well as reviewing the written report by the radiologist.  ED MD interpretation: I reviewed the CT head which does not show any acute intracranial process    ____________________________________________   PROCEDURES  Procedure(s) performed (including Critical  Care):  Procedures   ____________________________________________   INITIAL IMPRESSION / ASSESSMENT AND PLAN / ED COURSE     30 year old male with history of alcohol use disorder presents with altered mental status.  Found near the road, using alcohol.  On chart review he has been here for similar in the past.  Patient is not in any distress.  He is able to sit up and tell me that he was drinking, oriented to person and place but really not able to provide much other history.  He has no signs of trauma on exam.  Accu-Chek with EMS was normal.  Given he does admit to using alcohol will observe for clinical sobriety.  If not progressing as expected we will obtain a broader work-up.  Patient still pretty somnolent, I do expect that with his degree of intoxication that he will take a long time to metabolize so will get a CT head to take any intracranial injury off the table.  We will also get an Accu-Chek although was normal with EMS have dropped.  CT head was obtained although patient wanted to leave prior to the final read by radiology.  Ultimately was negative.  Patient was alert and oriented able to ambulate.  Admits to drinking alcohol.  Back to baseline stable for discharge.      ____________________________________________   FINAL CLINICAL IMPRESSION(S) / ED DIAGNOSES  Final diagnoses:  Alcoholic intoxication without complication [facility])     ED Discharge Orders     None        Note:  This document was prepared using Dragon voice recognition software and may include unintentional dictation errors.    Georga Hacking, MD 10/24/21 1640

## 2021-12-02 ENCOUNTER — Ambulatory Visit: Payer: Self-pay | Admitting: Gerontology

## 2021-12-31 ENCOUNTER — Ambulatory Visit: Payer: Self-pay | Admitting: Gerontology

## 2022-02-04 NOTE — Congregational Nurse Program (Signed)
°  Dept: [place] Nurse Program Note  Date of Encounter: 02/04/2022 Client now has a work schedule and is requests assistance with making an apt at the [facility]. Appointment made for 2/21 @ 2 pm. Reminder card given. Client plans to take the bus to this appointment.  Past Medical History: Past Medical History:  Diagnosis Date   Alcohol abuse 03/03/2020   Hypertension     Encounter Details:  CNP Questionnaire - 02/04/22 1247       Questionnaire   Do you give verbal consent to treat you today? Yes    Location [place] or Organization    Patient Status Homeless    Insurance Uninsured ([employer]/Self-Pay)    Insurance Referral N/A    Medication N/A   client is not currenlty taking any medication   Medical Provider Yes   new patient at [facility]   Screening Referrals N/A    Medical Referral [facility]    Medical Appointment Made [facility]   [facility] 2/21 @2  pm, client aware   Food Have Food Insecurities    Transportation N/A   client uses the [employer] bus system   Housing/Utilities No permanent housing    Chiropractor N/A    Intervention [place]   Apt made at [facility]   ED Visit Averted N/A    Life-Saving Intervention Made N/A

## 2022-02-09 ENCOUNTER — Ambulatory Visit: Payer: Self-pay | Admitting: Gerontology

## 2022-02-17 ENCOUNTER — Encounter: Payer: Self-pay | Admitting: Emergency Medicine

## 2022-02-17 ENCOUNTER — Other Ambulatory Visit: Payer: Self-pay

## 2022-02-17 DIAGNOSIS — Z23 Encounter for immunization: Secondary | ICD-10-CM | POA: Insufficient documentation

## 2022-02-17 DIAGNOSIS — K047 Periapical abscess without sinus: Secondary | ICD-10-CM | POA: Insufficient documentation

## 2022-02-17 DIAGNOSIS — R400 Somnolence: Secondary | ICD-10-CM | POA: Insufficient documentation

## 2022-02-17 DIAGNOSIS — S01511A Laceration without foreign body of lip, initial encounter: Secondary | ICD-10-CM | POA: Insufficient documentation

## 2022-02-17 DIAGNOSIS — I10 Essential (primary) hypertension: Secondary | ICD-10-CM | POA: Insufficient documentation

## 2022-02-17 DIAGNOSIS — E876 Hypokalemia: Secondary | ICD-10-CM | POA: Insufficient documentation

## 2022-02-17 DIAGNOSIS — F10120 Alcohol abuse with intoxication, uncomplicated: Secondary | ICD-10-CM | POA: Insufficient documentation

## 2022-02-17 DIAGNOSIS — Y908 Blood alcohol level of 240 mg/100 ml or more: Secondary | ICD-10-CM | POA: Insufficient documentation

## 2022-02-17 DIAGNOSIS — W010XXA Fall on same level from slipping, tripping and stumbling without subsequent striking against object, initial encounter: Secondary | ICD-10-CM | POA: Insufficient documentation

## 2022-02-17 NOTE — ED Triage Notes (Signed)
Patient ambulatory to triage with steady gait, without difficulty or distress noted; pt reports that he fell; laceration to upper lip with scant bleeding; denies any dental injury; denies any head or neck pain ?

## 2022-02-18 ENCOUNTER — Emergency Department: Payer: Self-pay

## 2022-02-18 ENCOUNTER — Emergency Department
Admission: EM | Admit: 2022-02-18 | Discharge: 2022-02-18 | Disposition: A | Discharge status: Home / Self Care | Payer: Self-pay | Attending: Emergency Medicine | Admitting: Emergency Medicine

## 2022-02-18 DIAGNOSIS — K047 Periapical abscess without sinus: Secondary | ICD-10-CM

## 2022-02-18 DIAGNOSIS — E876 Hypokalemia: Secondary | ICD-10-CM

## 2022-02-18 DIAGNOSIS — S01511A Laceration without foreign body of lip, initial encounter: Secondary | ICD-10-CM

## 2022-02-18 DIAGNOSIS — F10920 Alcohol use, unspecified with intoxication, uncomplicated: Secondary | ICD-10-CM

## 2022-02-18 LAB — CBC WITH DIFFERENTIAL/PLATELET
Abs Immature Granulocytes: 0.02 10*3/uL (ref 0.00–0.07)
Basophils Absolute: 0 10*3/uL (ref 0.0–0.1)
Basophils Relative: 0 %
Eosinophils Absolute: 0 10*3/uL (ref 0.0–0.5)
Eosinophils Relative: 0 %
HCT: 44.7 % (ref 39.0–52.0)
Hemoglobin: 14.9 g/dL (ref 13.0–17.0)
Immature Granulocytes: 0 %
Lymphocytes Relative: 38 %
Lymphs Abs: 2.7 10*3/uL (ref 0.7–4.0)
MCH: 28.1 pg (ref 26.0–34.0)
MCHC: 33.3 g/dL (ref 30.0–36.0)
MCV: 84.2 fL (ref 80.0–100.0)
Monocytes Absolute: 0.7 10*3/uL (ref 0.1–1.0)
Monocytes Relative: 10 %
Neutro Abs: 3.5 10*3/uL (ref 1.7–7.7)
Neutrophils Relative %: 52 %
Platelets: 273 10*3/uL (ref 150–400)
RBC: 5.31 MIL/uL (ref 4.22–5.81)
RDW: 13.8 % (ref 11.5–15.5)
WBC: 6.9 10*3/uL (ref 4.0–10.5)
nRBC: 0 % (ref 0.0–0.2)

## 2022-02-18 LAB — COMPREHENSIVE METABOLIC PANEL
ALT: 32 U/L (ref 0–44)
AST: 34 U/L (ref 15–41)
Albumin: 3.9 g/dL (ref 3.5–5.0)
Alkaline Phosphatase: 62 U/L (ref 38–126)
Anion gap: 18 — ABNORMAL HIGH (ref 5–15)
BUN: 6 mg/dL (ref 6–20)
CO2: 28 mmol/L (ref 22–32)
Calcium: 8.9 mg/dL (ref 8.9–10.3)
Chloride: 98 mmol/L (ref 98–111)
Creatinine, Ser: 0.72 mg/dL (ref 0.61–1.24)
GFR, Estimated: >60 mL/min (ref >60)
Glucose, Bld: 138 mg/dL — ABNORMAL HIGH (ref 70–99)
Potassium: 2.6 mmol/L — CL (ref 3.5–5.1)
Sodium: 144 mmol/L (ref 135–145)
Total Bilirubin: 0.5 mg/dL (ref 0.3–1.2)
Total Protein: 7.6 g/dL (ref 6.5–8.1)

## 2022-02-18 LAB — MAGNESIUM: Magnesium: 2.4 mg/dL (ref 1.7–2.4)

## 2022-02-18 LAB — ETHANOL: Alcohol, Ethyl (B): 319 mg/dL (ref <10)

## 2022-02-18 MED ORDER — AMOXICILLIN-POT CLAVULANATE 875-125 MG PO TABS: 1.0000 | ORAL_TABLET | Freq: Two times a day (BID) | ORAL | 0 refills | Status: AC

## 2022-02-18 MED ORDER — AMOXICILLIN-POT CLAVULANATE 875-125 MG PO TABS
1.0000 | ORAL_TABLET | Freq: Once | ORAL | Status: AC
  Administered 2022-02-18: 1 via ORAL
  Filled 2022-02-18: qty 1

## 2022-02-18 MED ORDER — LIDOCAINE HCL (PF) 1 % IJ SOLN
5.0000 mL | Freq: Once | INTRAMUSCULAR | Status: AC
  Administered 2022-02-18: 5 mL via INTRADERMAL
  Filled 2022-02-18: qty 5

## 2022-02-18 MED ORDER — POTASSIUM CHLORIDE CRYS ER 20 MEQ PO TBCR
40.0000 meq | EXTENDED_RELEASE_TABLET | Freq: Once | ORAL | Status: AC
  Administered 2022-02-18: 40 meq via ORAL
  Filled 2022-02-18: qty 2

## 2022-02-18 MED ORDER — POTASSIUM CHLORIDE CRYS ER 20 MEQ PO TBCR: 40.0000 meq | EXTENDED_RELEASE_TABLET | Freq: Every day | ORAL | 0 refills | Status: DC

## 2022-02-18 MED ORDER — POTASSIUM CHLORIDE 10 MEQ/100ML IV SOLN
10.0000 meq | INTRAVENOUS | Status: DC
  Administered 2022-02-18 (×2): 10 meq via INTRAVENOUS
  Filled 2022-02-18 (×2): qty 100

## 2022-02-18 MED ORDER — POTASSIUM CHLORIDE 10 MEQ/100ML IV SOLN: 10.0000 meq | INTRAVENOUS | Status: AC

## 2022-02-18 MED ORDER — THIAMINE HCL 100 MG/ML IJ SOLN
100.0000 mg | Freq: Once | INTRAMUSCULAR | Status: AC
  Administered 2022-02-18: 100 mg via INTRAVENOUS
  Filled 2022-02-18: qty 2

## 2022-02-18 MED ORDER — NEPHRO-VITE RX 1 MG PO TABS: 1.0000 | ORAL_TABLET | Freq: Every day | ORAL | 3 refills | Status: DC

## 2022-02-18 MED ORDER — SODIUM CHLORIDE 0.9 % IV BOLUS (SEPSIS)
1000.0000 mL | Freq: Once | INTRAVENOUS | Status: AC
  Administered 2022-02-18: 1000 mL via INTRAVENOUS

## 2022-02-18 MED ORDER — TETANUS-DIPHTH-ACELL PERTUSSIS 5-2.5-18.5 LF-MCG/0.5 IM SUSY
0.5000 mL | PREFILLED_SYRINGE | Freq: Once | INTRAMUSCULAR | Status: AC
  Administered 2022-02-18: 0.5 mL via INTRAMUSCULAR
  Filled 2022-02-18: qty 0.5

## 2022-02-18 MED ORDER — THIAMINE HCL 100 MG PO TABS: 100.0000 mg | ORAL_TABLET | Freq: Every day | ORAL | 3 refills | Status: DC

## 2022-02-18 NOTE — ED Notes (Signed)
Pt found to be walking round the room with steady gait. Pt requests to leave. D/C and new RX and reasons to return discussed with pt. Pt also educated on wound care and suture removal and care, pt verbal understanding. NAD noted on D/C. ?

## 2022-02-18 NOTE — ED Provider Notes (Signed)
[facility] Provider Note    Event Date/Time   First MD Initiated Contact with Patient 02/18/22 0033     (approximate)   History   Laceration   HPI  Wesley Doyle is a 31 y.o. male with history of hypertension, alcohol abuse who presents to the emergency department with a lip laceration.  Patient appears intoxicated and is unable to tell us how he injured himself.  He is somnolent but arousable but has slurred speech.   History provided by patient but limited due to intoxication.    Past Medical History:  Diagnosis Date   Alcohol abuse 03/03/2020   Hypertension     History reviewed. No pertinent surgical history.  MEDICATIONS:  Prior to Admission medications   Not on File    Physical Exam   Triage Vital Signs: ED Triage Vitals  Enc Vitals Group     BP 02/17/22 2325 (!) 147/89     Pulse Rate 02/17/22 2325 92     Resp 02/17/22 2325 18     Temp 02/17/22 2325 97.9 F (36.6 C)     Temp Source 02/17/22 2325 Oral     SpO2 02/17/22 2325 97 %     Weight 02/17/22 2309 215 lb (97.5 kg)     Height 02/17/22 2309 5\' 8"  (1.727 m)     Head Circumference --      Peak Flow --      Pain Score 02/17/22 2308 10     Pain Loc --      Pain Edu? --      Excl. in GC? --     Most recent vital signs: Vitals:   02/18/22 0300 02/18/22 0330  BP: 108/63 96/71  Pulse: 83 83  Resp: 15 18  Temp:    SpO2: 93% 94%     CONSTITUTIONAL: Alert but somnolent and falls asleep quickly.  Responds to noxious stimuli. HEAD: Normocephalic; 3 cm laceration through the upper left lip that goes through the vermilion border EYES: Conjunctivae clear, PERRL, EOMI ENT: normal nose; no rhinorrhea; moist mucous membranes; pharynx without lesions noted; no dental injury; no septal hematoma, no epistaxis; no facial deformity or bony tenderness NECK: Supple, no midline spinal tenderness, step-off or deformity; trachea midline CARD: RRR; S1 and S2 appreciated; no murmurs, no  clicks, no rubs, no gallops RESP: Normal chest excursion without splinting or tachypnea; breath sounds clear and equal bilaterally; no wheezes, no rhonchi, no rales; no hypoxia or respiratory distress CHEST:  chest wall stable, no crepitus or ecchymosis or deformity, nontender to palpation; no flail chest ABD/GI: Normal bowel sounds; non-distended; soft, non-tender, no rebound, no guarding; no ecchymosis or other lesions noted PELVIS:  stable, nontender to palpation BACK:  The back appears normal; no midline spinal tenderness, step-off or deformity EXT: Normal ROM in all joints; non-tender to palpation; no edema; normal capillary refill; no cyanosis, no bony tenderness or bony deformity of patient's extremities, no joint effusion, compartments are soft, extremities are warm and well-perfused, no ecchymosis SKIN: Normal color for age and race; warm NEURO: No facial asymmetry, normal speech, moving all extremities equally  ED Results / Procedures / Treatments   LABS: (all labs ordered are listed, but only abnormal results are displayed) Labs Reviewed  COMPREHENSIVE METABOLIC PANEL - Abnormal; Notable for the following components:      Result Value   Potassium 2.6 (*)    Glucose, Bld 138 (*)    Anion gap 18 (*)  All other components within normal limits  ETHANOL - Abnormal; Notable for the following components:   Alcohol, Ethyl (B) 319 (*)    All other components within normal limits  CBC WITH DIFFERENTIAL/PLATELET  MAGNESIUM  URINE DRUG SCREEN, QUALITATIVE [facility] ONLY)     EKG:  EKG Interpretation  Date/Time:  Thursday February 18 2022 00:32:45 EST Ventricular Rate:  76 PR Interval:  174 QRS Duration: 94 QT Interval:  387 QTC Calculation: 436 R Axis:   -20 Text Interpretation: Sinus rhythm Borderline left axis deviation Confirmed by Rochele Raring [phone]) on 02/18/2022 2:02:20 AM          RADIOLOGY: My personal review and interpretation of imaging: CT scan showed a left  upper lip laceration.  No other acute traumatic injury.  I have personally reviewed all radiology reports. CT HEAD WO CONTRAST ( )  Result Date: 02/18/2022 CLINICAL DATA:  Head trauma, abnormal mental status (Age 26-64y); Neck trauma, intoxicated or obtunded (Age >= 16y); Facial trauma, blunt EXAM: CT HEAD WITHOUT CONTRAST CT MAXILLOFACIAL WITHOUT CONTRAST CT CERVICAL SPINE WITHOUT CONTRAST TECHNIQUE: Multidetector CT imaging of the head, cervical spine, and maxillofacial structures were performed using the standard protocol without intravenous contrast. Multiplanar CT image reconstructions of the cervical spine and maxillofacial structures were also generated. RADIATION DOSE REDUCTION: This exam was performed according to the departmental dose-optimization program which includes automated exposure control, adjustment of the mA and/or kV according to patient size and/or use of iterative reconstruction technique. COMPARISON:  None. FINDINGS: CT HEAD FINDINGS Brain: Normal anatomic configuration. No abnormal intra or extra-axial mass lesion or fluid collection. No abnormal mass effect or midline shift. No evidence of acute intracranial hemorrhage or infarct. Ventricular size is normal. Cerebellum unremarkable. Vascular: Unremarkable Skull: Intact Other: Mastoid air cells and middle ear cavities are clear. CT MAXILLOFACIAL FINDINGS Osseous: No fracture or mandibular dislocation. A periapical abscess surrounds the root of tooth 3 with erosion both through the buccal cortex of the maxilla as well as into the right maxillary sinus multiple dental caries are identified. Orbits: Negative. No traumatic or inflammatory finding. Sinuses: Small mucous retention cyst within the right maxillary sinus. Remaining paranasal sinuses are clear. Soft tissues: Shallow laceration of the left upper lip. Soft tissue infiltration within the right malar soft tissues in keeping with subcutaneous edema or hemorrhage related to direct  trauma. CT CERVICAL SPINE FINDINGS Alignment: Normal. Skull base and vertebrae: No acute fracture. No primary bone lesion or focal pathologic process. Soft tissues and spinal canal: No prevertebral fluid or swelling. No visible canal hematoma. Disc levels: Intervertebral disc height and vertebral body heights are preserved. Prevertebral soft tissues are not thickened. Spinal canal is widely patent. No significant neuroforaminal narrowing. Upper chest: Unremarkable Other: None IMPRESSION: No acute intracranial abnormality.  No calvarial fracture. Show laceration of the left upper lip and mild subcutaneous edema or hemorrhage within the right malar soft tissues. Periapical abscess surrounding the root of tooth 3. Multiple dental caries. No acute fracture or listhesis of the cervical spine. Electronically Signed   By: Helyn Numbers M.D.   On: 02/18/2022 01:28   CT Cervical Spine Wo Contrast  Result Date: 02/18/2022 CLINICAL DATA:  Head trauma, abnormal mental status (Age 8-64y); Neck trauma, intoxicated or obtunded (Age >= 16y); Facial trauma, blunt EXAM: CT HEAD WITHOUT CONTRAST CT MAXILLOFACIAL WITHOUT CONTRAST CT CERVICAL SPINE WITHOUT CONTRAST TECHNIQUE: Multidetector CT imaging of the head, cervical spine, and maxillofacial structures were performed using the standard protocol without intravenous contrast. Multiplanar CT  image reconstructions of the cervical spine and maxillofacial structures were also generated. RADIATION DOSE REDUCTION: This exam was performed according to the departmental dose-optimization program which includes automated exposure control, adjustment of the mA and/or kV according to patient size and/or use of iterative reconstruction technique. COMPARISON:  None. FINDINGS: CT HEAD FINDINGS Brain: Normal anatomic configuration. No abnormal intra or extra-axial mass lesion or fluid collection. No abnormal mass effect or midline shift. No evidence of acute intracranial hemorrhage or infarct.  Ventricular size is normal. Cerebellum unremarkable. Vascular: Unremarkable Skull: Intact Other: Mastoid air cells and middle ear cavities are clear. CT MAXILLOFACIAL FINDINGS Osseous: No fracture or mandibular dislocation. A periapical abscess surrounds the root of tooth 3 with erosion both through the buccal cortex of the maxilla as well as into the right maxillary sinus multiple dental caries are identified. Orbits: Negative. No traumatic or inflammatory finding. Sinuses: Small mucous retention cyst within the right maxillary sinus. Remaining paranasal sinuses are clear. Soft tissues: Shallow laceration of the left upper lip. Soft tissue infiltration within the right malar soft tissues in keeping with subcutaneous edema or hemorrhage related to direct trauma. CT CERVICAL SPINE FINDINGS Alignment: Normal. Skull base and vertebrae: No acute fracture. No primary bone lesion or focal pathologic process. Soft tissues and spinal canal: No prevertebral fluid or swelling. No visible canal hematoma. Disc levels: Intervertebral disc height and vertebral body heights are preserved. Prevertebral soft tissues are not thickened. Spinal canal is widely patent. No significant neuroforaminal narrowing. Upper chest: Unremarkable Other: None IMPRESSION: No acute intracranial abnormality.  No calvarial fracture. Show laceration of the left upper lip and mild subcutaneous edema or hemorrhage within the right malar soft tissues. Periapical abscess surrounding the root of tooth 3. Multiple dental caries. No acute fracture or listhesis of the cervical spine. Electronically Signed   By: Helyn Numbers M.D.   On: 02/18/2022 01:28   CT Maxillofacial Wo Contrast  Result Date: 02/18/2022 CLINICAL DATA:  Head trauma, abnormal mental status (Age 78-64y); Neck trauma, intoxicated or obtunded (Age >= 16y); Facial trauma, blunt EXAM: CT HEAD WITHOUT CONTRAST CT MAXILLOFACIAL WITHOUT CONTRAST CT CERVICAL SPINE WITHOUT CONTRAST TECHNIQUE:  Multidetector CT imaging of the head, cervical spine, and maxillofacial structures were performed using the standard protocol without intravenous contrast. Multiplanar CT image reconstructions of the cervical spine and maxillofacial structures were also generated. RADIATION DOSE REDUCTION: This exam was performed according to the departmental dose-optimization program which includes automated exposure control, adjustment of the mA and/or kV according to patient size and/or use of iterative reconstruction technique. COMPARISON:  None. FINDINGS: CT HEAD FINDINGS Brain: Normal anatomic configuration. No abnormal intra or extra-axial mass lesion or fluid collection. No abnormal mass effect or midline shift. No evidence of acute intracranial hemorrhage or infarct. Ventricular size is normal. Cerebellum unremarkable. Vascular: Unremarkable Skull: Intact Other: Mastoid air cells and middle ear cavities are clear. CT MAXILLOFACIAL FINDINGS Osseous: No fracture or mandibular dislocation. A periapical abscess surrounds the root of tooth 3 with erosion both through the buccal cortex of the maxilla as well as into the right maxillary sinus multiple dental caries are identified. Orbits: Negative. No traumatic or inflammatory finding. Sinuses: Small mucous retention cyst within the right maxillary sinus. Remaining paranasal sinuses are clear. Soft tissues: Shallow laceration of the left upper lip. Soft tissue infiltration within the right malar soft tissues in keeping with subcutaneous edema or hemorrhage related to direct trauma. CT CERVICAL SPINE FINDINGS Alignment: Normal. Skull base and vertebrae: No acute fracture. No  primary bone lesion or focal pathologic process. Soft tissues and spinal canal: No prevertebral fluid or swelling. No visible canal hematoma. Disc levels: Intervertebral disc height and vertebral body heights are preserved. Prevertebral soft tissues are not thickened. Spinal canal is widely patent. No  significant neuroforaminal narrowing. Upper chest: Unremarkable Other: None IMPRESSION: No acute intracranial abnormality.  No calvarial fracture. Show laceration of the left upper lip and mild subcutaneous edema or hemorrhage within the right malar soft tissues. Periapical abscess surrounding the root of tooth 3. Multiple dental caries. No acute fracture or listhesis of the cervical spine. Electronically Signed   By: Helyn NumbersAshesh  Parikh M.D.   On: 02/18/2022 01:28     PROCEDURES:  Critical Care performed: YES   CRITICAL CARE Performed by: Baxter HireKristen Lakaisha Danish   Total critical care time: 45 minutes  Critical care time was exclusive of separately billable procedures and treating other patients.  Critical care was necessary to treat or prevent imminent or life-threatening deterioration.  Critical care was time spent personally by me on the following activities: development of treatment plan with patient and/or surrogate as well as nursing, discussions with consultants, evaluation of patient's response to treatment, examination of patient, obtaining history from patient or surrogate, ordering and performing treatments and interventions, ordering and review of laboratory studies, ordering and review of radiographic studies, pulse oximetry and re-evaluation of patient's condition.   Marland Kitchen.1-3 Lead EKG Interpretation Performed by: Elaysia Devargas, Layla MawKristen N, DO Authorized by: Danuta Huseman, Layla MawKristen N, DO     Interpretation: normal     ECG rate:  95   ECG rate assessment: normal     Rhythm: sinus rhythm     Ectopy: none     Conduction: normal     LACERATION REPAIR Performed by: Rochele RaringKristen Tajuanna Burnett Authorized by: Rochele RaringKristen Fanny Agan Consent: Verbal consent obtained. Risks and benefits: risks, benefits and alternatives were discussed Consent given by: patient Patient identity confirmed: provided demographic data Prepped and Draped in normal sterile fashion Wound explored  Laceration Location: Upper lip  Laceration Length: 3cm  No  Foreign Bodies seen or palpated  Anesthesia: local infiltration  Local anesthetic: lidocaine 1% without epinephrine  Anesthetic total: 4 ml  Irrigation method: syringe Amount of cleaning: standard  Skin closure: Superficial  Number of sutures: 5  Technique: Area anesthetized using lidocaine 1% without epinephrine. Wound irrigated copiously with sterile saline. Wound then cleaned with Betadine and draped in sterile fashion. Wound closed using 5 simple interrupted sutures with 5-0 Monocryl.  Good wound approximation and hemostasis achieved.    Patient tolerance: Patient tolerated the procedure well with no immediate complications.   IMPRESSION / MDM / ASSESSMENT AND PLAN / ED COURSE  I reviewed the triage vital signs and the nursing notes.  Patient here with obvious head trauma, lip laceration while intoxicated.  The patient is on the cardiac monitor to evaluate for evidence of arrhythmia and/or significant heart rate changes.   DIFFERENTIAL DIAGNOSIS (includes but not limited to):   Lip laceration, concussion, intracranial hemorrhage, skull fracture, cervical spine fracture, facial fracture, intoxication, hypoglycemia   PLAN: We will obtain CBC, CMP, CBG, alcohol level, urine drug screen, CTs of the head, face and neck.  We will update his tetanus vaccine.  Will give IV fluids, thiamine.  Patient will need repair of his lip laceration.   MEDICATIONS GIVEN IN ED: Medications  potassium chloride 10 mEq in 100 mL IVPB (10 mEq Intravenous Not Given 02/18/22 0335)  sodium chloride 0.9 % bolus 1,000 mL (0 mLs Intravenous  Stopped 02/18/22 0124)  Tdap (BOOSTRIX) injection 0.5 mL (0.5 mLs Intramuscular Given 02/18/22 0117)  lidocaine (PF) (XYLOCAINE) 1 % injection 5 mL (5 mLs Intradermal Given by Other 02/18/22 0118)  thiamine (B-1) injection 100 mg (100 mg Intravenous Given 02/18/22 0121)  potassium chloride SA (KLOR-CON M) CR tablet 40 mEq (40 mEq Oral Given 02/18/22 0220)   amoxicillin-clavulanate (AUGMENTIN) 875-125 MG per tablet 1 tablet (1 tablet Oral Given 02/18/22 0220)     ED COURSE: Patient CT scans reviewed by myself and radiology.  No acute traumatic injury noted other than his lip laceration.  Radiologist sees that he has a small periapical dental abscess.  We will start him on Augmentin that will also be prophylactic for his lip laceration.  Will give dental follow-up information.  Labs show potassium of 2.6 without any EKG changes.  Magnesium level is normal.  He is getting oral and IV replacement.  He is on cardiac monitoring.  Alcohol level of 319.  Otherwise work-up reassuring with normal electrolytes, renal function, hemoglobin, white blood cell count.  We will continue to monitor until clinically sober.  After laceration repair, patient more awake.  He is able to tell us that he tripped and fell.  Denies any other injuries.  Denies any assault.  We will continue to monitor until clinically sober.  He does not have any family at the bedside currently.  He is getting IV fluids and thiamine.  No signs of withdrawal.  Patient able to ambulate and tolerate p.o.  Discussed head injury return precautions, wound care instructions.  Given dental follow-up and information for [facility].  Discharge with prescriptions for multivitamin with folic acid as well as thiamine.  Have recommended abstinence from alcohol.  At this time, I do not feel there is any life-threatening condition present. I reviewed all nursing notes, vitals, pertinent previous records.  All lab and urine results, EKGs, imaging ordered have been independently reviewed and interpreted by myself.  I reviewed all available radiology reports from any imaging ordered this visit.  Based on my assessment, I feel the patient is safe to be discharged home without further emergent workup and can continue workup as an outpatient as needed. Discussed all findings, treatment plan as well as usual and customary  return precautions with patient.  They verbalize understanding and are comfortable with this plan.  Outpatient follow-up has been provided as needed.  All questions have been answered.   CONSULTS: No admission needed at this time given no acute traumatic injury seen on CT imaging other than lip laceration which was repaired and no EKG changes or events noted on cardiac monitoring due to his hypokalemia.   OUTSIDE RECORDS REVIEWED: Reviewed patient's last office visit with Alcide Evener NP on 04/30/2020.         FINAL CLINICAL IMPRESSION(S) / ED DIAGNOSES   Final diagnoses:  Lip laceration, initial encounter  Alcoholic intoxication without complication (HCC)  Hypokalemia  Dental abscess     Rx / DC Orders   ED Discharge Orders          Ordered    amoxicillin-clavulanate (AUGMENTIN) 875-125 MG tablet  2 times daily        02/18/22 0207    thiamine 100 MG tablet  Daily        02/18/22 0207    B Complex-C-Folic Acid (B COMPLEX-VITAMIN C-FOLIC ACID) 1 MG tablet  Daily with breakfast        02/18/22 0207    potassium chloride SA (  KLOR-CON Judie PetitM) 20 MEQ tablet  Daily        02/18/22 0427             Note:  This document was prepared using Dragon voice recognition software and may include unintentional dictation errors.   Ryszard Socarras, Layla MawKristen N, DO 02/18/22 [phone]

## 2022-02-18 NOTE — ED Notes (Signed)
Pt provided a [place] sandwich and OJ - Pt A&Ox3. ? ? ?

## 2022-02-18 NOTE — Discharge Instructions (Addendum)
You may alternate Tylenol 1000 mg every 6 hours as needed for pain, fever and Ibuprofen 800 mg every 6-8 hours as needed for pain, fever.  Please take Ibuprofen with food.  Do not take more than 4000 mg of Tylenol (acetaminophen) in a 24 hour period.  Steps to find a Primary Care Provider (PCP):  Call [phone] or [phone] to access "[place] Find a Doctor Service."  2.  You may also go on the [facility] website at [email or web address]  

## 2022-02-25 NOTE — Congregational Nurse Program (Signed)
?  Dept: [phone] ? ? ?Congregational Nurse Program Note ? ?Date of Encounter: 02/25/2022 ?Client to clinic for suture removal from his right upper lip. Sutures were placed in the [facility] ED on All sutures removed, no s/s of infection, small open area to the base of cut, no bleeding or drainage. Area appeared to have healed well. Client denied pain. Instructed client to try to keep the area clean and watch for any changes, signs of drainage or redness.  ? ?Past Medical History: ?Past Medical History:  ?Diagnosis Date  ? Alcohol abuse 03/03/2020  ? Hypertension   ? ? ?Encounter Details: ? CNP Questionnaire - 02/25/22 1235   ? ?  ? Questionnaire  ? Do you give verbal consent to treat you today? Yes   ? Location Patient Served  [facility]   ? Visit Setting Church or Organization   ? Patient Status Homeless   ? Insurance Uninsured ([employer]/Self-Pay)   ? Insurance Referral N/A   ? Medication N/A   client is not currenlty taking any medication  ? Medical Provider Yes   new patient at [facility]  ? Screening Referrals N/A   ? Medical Referral N/A   ? Medical Appointment Made N/A   [facility] 2/21 @2  pm, client aware  ? Food Have Food Insecurities   ? Transportation N/A   client uses the [place] bus system  ? Housing/Utilities No permanent housing   ? Interpersonal Safety N/A   ? Intervention Support   Apt made at [facility]  ? ED Visit Averted Yes   RN removed clients sutures so he did not ahve to return to the ED for removal  ? Life-Saving Intervention Made N/A   ? ?  ?  ? ?  ? ? ? ? ?

## 2022-03-02 ENCOUNTER — Emergency Department
Admission: EM | Admit: 2022-03-02 | Discharge: 2022-03-02 | Discharge status: Against Medical Advice | Payer: Self-pay | Attending: Emergency Medicine | Admitting: Emergency Medicine

## 2022-03-02 ENCOUNTER — Other Ambulatory Visit: Payer: Self-pay

## 2022-03-02 DIAGNOSIS — I10 Essential (primary) hypertension: Secondary | ICD-10-CM | POA: Insufficient documentation

## 2022-03-02 DIAGNOSIS — Z5900 Homelessness unspecified: Secondary | ICD-10-CM | POA: Insufficient documentation

## 2022-03-02 DIAGNOSIS — R569 Unspecified convulsions: Secondary | ICD-10-CM | POA: Insufficient documentation

## 2022-03-02 NOTE — ED Triage Notes (Signed)
[facility] at homeless [facility], had witnessed whole body seizure lasting approx 2-3 minutes. Pt post-ictal upon ems arrival. No known seizure history. ?

## 2022-03-02 NOTE — ED Notes (Signed)
Pt requesting to leave AMA, pt A&Ox4. MD at bedside to discuss with patient risks of leaving AMA. Pt indicates verbal understanding and is still requesting to leave without medical treatment. ?

## 2022-03-02 NOTE — ED Provider Notes (Signed)
? ?[facility] ?Provider Note ? ? ? Event Date/Time  ? First MD Initiated Contact with Patient 03/02/22 1953   ?  (approximate) ? ?History  ? ?Chief Complaint: Seizures ? ?HPI ? ?Wesley Doyle is a 31 y.o. male with a past medical history of alcohol use, hypertension presents to the emergency department after a seizure.  According to report patient is coming from a homeless shelter where he had a witnessed seizure lasting 2 to 3 minutes.  Patient denies any history of seizures previously.  Patient states he does not want to be evaluated, does not want any work-up done or an examination.  Patient states he wants to go home.  I spoke to the patient regarding this, he states he has never had a seizure, but does drink alcohol most days of the week.  States he did not drink today because he did not feel like it.  Highly suspect withdrawal seizure.  I spoke to the patient stating normally we would do a work-up including lab work urine and a CT scan of the head given he has no prior seizure history.  Patient understands but wishes not to proceed with any further work-up.  Patient is alert he is oriented x4, has capacity to make his own medical decisions and is not postictal and wishes to go home we will discharge AMA. ? ?Physical Exam  ? ?Triage Vital Signs: ?ED Triage Vitals  ?Enc Vitals Group  ?   BP 03/02/22 1946 (!) 162/103  ?   Pulse Rate 03/02/22 1946 82  ?   Resp 03/02/22 1946 18  ?   Temp 03/02/22 1946 97.6 ?F (36.4 ?C)  ?   Temp Source 03/02/22 1946 Oral  ?   SpO2 03/02/22 1946 97 %  ?   Weight 03/02/22 1943 213 lb 13.5 oz (97 kg)  ?   Height 03/02/22 1943 5\' 8"  (1.727 m)  ?   Head Circumference --   ?   Peak Flow --   ?   Pain Score 03/02/22 1943 0  ?   Pain Loc --   ?   Pain Edu? --   ?   Excl. in GC? --   ? ? ?Most recent vital signs: ?Vitals:  ? 03/02/22 1946  ?BP: (!) 162/103  ?Pulse: 82  ?Resp: 18  ?Temp: 97.6 ?F (36.4 ?C)  ?SpO2: 97%  ? ? ?General: Awake, no distress.  Oriented  x3 ?CV:  Good signs of peripheral perfusion. ?Resp:  Normal effort. ? ? ?ED Results / Procedures / Treatments  ? ?EKG ? ?EKG viewed and interpreted by myself shows a normal sinus rhythm at 77 bpm with a narrow QRS, normal axis, normal intervals, no concerning ST changes. ? ?IMPRESSION / MDM / ASSESSMENT AND PLAN / ED COURSE  ?I reviewed the triage vital signs and the nursing notes. ? ?Patient presents to the emergency department after witnessed seizure activity.  Highly suspect alcohol withdrawal seizure however I discussed with the patient further work-up including lab work urine and CT imaging given no prior seizure history.  Patient is refusing.  Refusing physical exam.  Refusing lab work and CT imaging.  As the patient is awake alert oriented x4 no distress does not appear postictal and appears to have capacity to make his own medical decisions we will allow the patient to sign out AGAINST MEDICAL ADVICE.  I discussed with the patient alcohol withdrawal seizures and the need to slowly reduce the amount of alcohol  over time to avoid seizure activity.  Patient understands.  Still wishes to go home. ? ?FINAL CLINICAL IMPRESSION(S) / ED DIAGNOSES  ? ?Seizure ? ? ?Note:  This document was prepared using Dragon voice recognition software and may include unintentional dictation errors. ?  ?Minna Antis, MD ?03/02/22 2000 ? ?

## 2022-03-03 NOTE — Congregational Nurse Program (Signed)
?  Dept: [phone] ? ? ?Congregational Nurse Program Note ? ?Date of Encounter: 03/03/2022 ?Client to clinic this morning with report of "having a stroke" lastngiht. Epic notes reviewed, client was taken to the [facility] via EMS with possible seizure. Remembers waking up in the ambulance, but nothing prior to that. No medications received, client ended up leaving AMA once he was fully alert. He denied any drug or alcohol use yesterday prior to the incident. Reports feeling "fine" this morning ?BP (!) 152/102 (BP Location: Left Arm, Patient Position: Sitting, Cuff Size: Large)   Pulse 98   SpO2 93%  ?Client agreeable to RN setting up an [facility] appointment. ?Appointment made for 3/16 at 6:30. RN stressed importance of going to this appointment. Support given. ? ?Past Medical History: ?Past Medical History:  ?Diagnosis Date  ? Alcohol abuse 03/03/2020  ? Hypertension   ? ? ?Encounter Details: ? CNP Questionnaire - 03/03/22 0850   ? ?  ? Questionnaire  ? Do you give verbal consent to treat you today? Yes   ? Location Patient Served  [facility]   ? Visit Setting Church or Organization   ? Patient Status Homeless   ? Insurance Uninsured ([employer]s/Self-Pay)   ? Insurance Referral N/A   ? Medication N/A   client is not currenlty taking any medication  ? Medical Provider Yes   new patient at [facility]  ? Screening Referrals N/A   ? Medical Referral N/A   ? Medical Appointment Made [facility] PCP/clinic   ? Food Have Food Insecurities   ? Transportation N/A   client uses the [place] bus system  ? Housing/Utilities No permanent housing   ? Interpersonal Safety N/A   ? Intervention Support;Blood pressure;Advocate;Educate   Apt made at [facility]  ? ED Visit Averted N/A   RN removed clients sutures so he did not ahve to return to the ED for removal  ? Life-Saving Intervention Made N/A   ? ?  ?  ? ?  ? ? ? ? ?

## 2022-03-04 ENCOUNTER — Ambulatory Visit: Payer: Self-pay

## 2022-03-09 ENCOUNTER — Other Ambulatory Visit: Payer: Self-pay

## 2022-03-09 ENCOUNTER — Encounter: Payer: Self-pay | Admitting: Gerontology

## 2022-03-09 ENCOUNTER — Ambulatory Visit: Payer: Self-pay | Admitting: Gerontology

## 2022-03-09 VITALS — BP 150/90 | HR 67 | Temp 98.0°F | Resp 16 | Ht 70.0 in | Wt 206.8 lb

## 2022-03-09 DIAGNOSIS — Z7689 Persons encountering health services in other specified circumstances: Secondary | ICD-10-CM

## 2022-03-09 DIAGNOSIS — F17200 Nicotine dependence, unspecified, uncomplicated: Secondary | ICD-10-CM | POA: Insufficient documentation

## 2022-03-09 DIAGNOSIS — I10 Essential (primary) hypertension: Secondary | ICD-10-CM

## 2022-03-09 MED ORDER — BLOOD PRESSURE KIT
1.0000 | PACK | Freq: Every day | 0 refills | Status: DC
  Filled 2022-03-09: qty 1, fill #0

## 2022-03-09 MED ORDER — AMLODIPINE BESYLATE 5 MG PO TABS
5.0000 mg | ORAL_TABLET | Freq: Every day | ORAL | 0 refills | Status: DC
  Filled 2022-03-09: qty 30, 30d supply, fill #0

## 2022-03-09 NOTE — Progress Notes (Signed)
? ?New Patient Office Visit ? ?Subjective:  ?Patient ID: Wesley Doyle, male    DOB: [birth year]  Age: 31 y.o. MRN: [id number] ? ?CC:  ?Chief Complaint  ?Patient presents with  ? re-establish care  ? ? ?HPI ?Wesley Doyle is a 31 year old male who has history of alcohol use, hypertension and presents to reestablish care and evaluation of his chronic conditions. He has a history of hypertension and does not take any medication, smokes 1/2 pack of cigarette daily and admits the desire to quit. ?He was seen at the ED on 03/02/22  for seizure, he declined any work up and left AMA. He denies any seizure activities since the ED visit. He states that his mood is good, denies suicidal no homicidal ideation.  Overall, he states that he is doing well and offers no further complaint. ? ?Past Medical History:  ?Diagnosis Date  ? Alcohol abuse 03/03/2020  ? Hypertension   ? ? ?Past Surgical History:  ?Procedure Laterality Date  ? NO PAST SURGERIES    ? ? ?Family History  ?Problem Relation Age of Onset  ? Other Mother   ?     murdered  ? Diabetes Father   ? Hypertension Maternal Grandmother   ? Diabetes Maternal Grandmother   ? Other Maternal Grandfather   ?     unknown medical history  ? Other Paternal Grandmother   ?     unknown medical history  ? Other Paternal Grandfather   ?     unknown medical history  ? ? ?Social History  ? ?Socioeconomic History  ? Marital status: Single  ?  Spouse name: Not on file  ? Number of children: 1  ? Years of education: Not on file  ? Highest education level: Not on file  ?Occupational History  ? Not on file  ?Tobacco Use  ? Smoking status: Every Day  ?  Packs/day: 0.50  ?  Years: 15.00  ?  Pack years: 7.50  ?  Types: Cigarettes  ? Smokeless tobacco: Never  ?Vaping Use  ? Vaping Use: Never used  ?Substance and Sexual Activity  ? Alcohol use: Yes  ?  Comment: 1/5 of liquor 3 times per week  ? Drug use: Yes  ?  Frequency: 3.0 times per week  ?  Types: Marijuana  ?  Comment: last use 03/08/22  ?  Sexual activity: Not Currently  ?Other Topics Concern  ? Not on file  ?Social History Narrative  ? Currently resides in [facility] for hx of alcohol abuse.  ? ?Social Determinants of Health  ? ?Financial Resource Strain: Not on file  ?Food Insecurity: No Food Insecurity  ? Worried About Charity fundraiser in the Last Year: Never true  ? Ran Out of Food in the Last Year: Never true  ?Transportation Needs: No Transportation Needs  ? Lack of Transportation (Medical): No  ? Lack of Transportation (Non-Medical): No  ?Physical Activity: Not on file  ?Stress: Not on file  ?Social Connections: Not on file  ?Intimate Partner Violence: Not on file  ? ? ?ROS ?Review of Systems  ?Constitutional: Negative.   ?HENT: Negative.    ?Eyes: Negative.   ?Respiratory: Negative.    ?Cardiovascular: Negative.   ?Gastrointestinal: Negative.   ?Endocrine: Negative.   ?Genitourinary: Negative.   ?Musculoskeletal: Negative.   ?Skin: Negative.   ?Neurological: Negative.   ?Hematological: Negative.   ?Psychiatric/Behavioral: Negative.    ? ?Objective:  ? ?Today's Vitals: BP (!) 150/90 (  BP Location: Left Arm, Patient Position: Sitting, Cuff Size: Large)   Pulse 67   Temp 98 ?F (36.7 ?C) (Oral)   Resp 16   Ht 5' 10"  (1.778 m)   Wt 206 lb 12.8 oz (93.8 kg)   SpO2 97%   BMI 29.67 kg/m?  ? ?Physical Exam ?HENT:  ?   Head: Normocephalic and atraumatic.  ?   Mouth/Throat:  ?   Mouth: Mucous membranes are moist.  ?Eyes:  ?   Extraocular Movements: Extraocular movements intact.  ?   Conjunctiva/sclera: Conjunctivae normal.  ?   Pupils: Pupils are equal, round, and reactive to light.  ?Cardiovascular:  ?   Rate and Rhythm: Normal rate and regular rhythm.  ?   Pulses: Normal pulses.  ?   Heart sounds: Normal heart sounds.  ?Pulmonary:  ?   Effort: Pulmonary effort is normal.  ?   Breath sounds: Normal breath sounds.  ?Abdominal:  ?   General: Abdomen is flat. Bowel sounds are normal.  ?   Palpations: Abdomen is soft.  ?Genitourinary: ?    Comments: Deferred per patient ?Musculoskeletal:     ?   General: Normal range of motion.  ?   Cervical back: Normal range of motion.  ?Skin: ?   General: Skin is warm.  ?Neurological:  ?   General: No focal deficit present.  ?   Mental Status: He is alert and oriented to person, place, and time. Mental status is at baseline.  ?Psychiatric:     ?   Mood and Affect: Mood normal.     ?   Behavior: Behavior normal.     ?   Thought Content: Thought content normal.     ?   Judgment: Judgment normal.  ? ? ?Assessment & Plan:  ? ? ?1. Essential hypertension ?-His blood pressure is not under control, his goal should be less than 140/90.  He was started on amlodipine 5 mg daily, was educated on medication side effects and advised to notify clinic and go to the ED.  He was encouraged to continue on DASH diet, exercise as tolerated.  He was provided with blood pressure machine and advised to check his blood pressure daily, record and bring log to follow up appointment.  He was educated on the signs and symptoms of stroke and advised to go to the emergency room. ?- amLODipine (NORVASC) 5 MG tablet; Take 1 tablet (5 mg total) by mouth once daily.  Dispense: 30 tablet; Refill: 0 ?- Blood Pressure KIT; Use as directed once daily.  Dispense: 1 kit; Refill: 0 ? ?2. Encounter to establish care ?-Was advised to adhere to after visit summary ? ?3. Smoking ?-He was encouraged on smoking cessation, he states that he will think about quitting. ? ? ? ? ?Follow-up: Return in about 30 days (around 04/08/2022), or if symptoms worsen or fail to improve.  ? ?Forney Kleinpeter Jerold Coombe, NP ? ?

## 2022-03-09 NOTE — Patient Instructions (Signed)

## 2022-03-11 ENCOUNTER — Other Ambulatory Visit: Payer: Self-pay

## 2022-03-11 NOTE — Congregational Nurse Program (Signed)
?  Dept: [phone] ? ? ?Congregational Nurse Program Note ? ?Date of Encounter: 03/11/2022 ?Client to clinic for BP check. He has been started on Amlodipine 5 mg after [facility] appointment on 3/21. Medication picked up at [facility] and given to patient this morning. He has taken his first dose. ?BP (!) 152/98 (BP Location: Left Arm, Patient Position: Sitting, Cuff Size: Large ?Client was given a wrist blood pressure cuff, but the readings are inaccurate, reading considerably higher than manual cuff.  ?Client comes to clinic regularly and has agreed to having BP monitored daily by RN.  ? ?Past Medical History: ?Past Medical History:  ?Diagnosis Date  ? Alcohol abuse 03/03/2020  ? Hypertension   ? ? ?Encounter Details: ? CNP Questionnaire - 03/11/22 0937   ? ?  ? Questionnaire  ? Do you give verbal consent to treat you today? Yes   ? Location Patient Served  [facility]   ? Visit Setting Church or Organization   ? Patient Status Homeless   ? Insurance Uninsured ([employer] Card/[employer] Connects/Self-Pay)   ? Insurance Referral N/A   ? Medication N/A   client is not currenlty taking any medication  ? Medical Provider Yes   new patient at [facility]  ? Screening Referrals N/A   ? Medical Referral [facility]inic   ? Medical Appointment Made [facility]   ? Food Have Food Insecurities   ? Transportation N/A   client uses the [place] bus system  ? Housing/Utilities No permanent housing   ? Interpersonal Safety N/A   ? Intervention Support;Blood pressure;Advocate;Educate   Apt made at [facility]  ? ED Visit Averted N/A   RN removed clients sutures so he did not ahve to return to the ED for removal  ? Life-Saving Intervention Made N/A   ? ?  ?  ? ?  ? ? ? ? ?

## 2022-03-16 NOTE — Congregational Nurse Program (Signed)
?  Dept: [phone] ? ? ?Congregational Nurse Program Note ? ?Date of Encounter: 03/16/2022 ?Client to clinic for blood pressure check. He was started on Amlodipine 5 mg tabs on 3/23. ?BP (!) 158/102 (BP Location: Left Arm, Patient Position: Sitting, Cuff Size: Large)   Pulse 65   SpO2 99%  ?BP remains elevated, he reports having taken his medication prior to visit this morning and over the weekend. Client agreeable to return to clinic again tomorrow for continued BP monitoring as his BP remains elevated. He denied headache or dizziness. ?Past Medical History: ?Past Medical History:  ?Diagnosis Date  ? Alcohol abuse 03/03/2020  ? Hypertension   ? ? ?Encounter Details: ? CNP Questionnaire - 03/16/22 1000   ? ?  ? Questionnaire  ? Do you give verbal consent to treat you today? Yes   ? Location Patient Served  [facility]   ? Visit Setting Church or Organization   ? Patient Status Unknown   client reports sleeping in a "house down the street"  ? Insurance Uninsured ([employer] Card/[employer] Connects/Self-Pay)   ? Insurance Referral N/A   ? Medication N/A   Medication supplied by Medication Management  ? Medical Provider Yes   new patient at [facility]  ? Screening Referrals N/A   ? Medical Referral N/A   [facility]  ? Medical Appointment Made [facility] PCP/clinic   appointment made for 3/22 at 2 pm, client did attend this appointment  ? Food N/A   ? Transportation N/A   client uses the [place] bus system  ? Housing/Utilities No permanent housing   client reports living in a house near the clinic  ? Interpersonal Safety N/A   ? Intervention Support;Blood pressure;Advocate;Educate   Apt made at [facility]  ? ED Visit Averted N/A   RN removed clients sutures so he did not ahve to return to the ED for removal  ? Life-Saving Intervention Made N/A   ? ?  ?  ? ?  ? ? ? ? ?

## 2022-03-17 NOTE — Congregational Nurse Program (Signed)
?  Dept: [phone] ? ? ?Congregational Nurse Program Note ? ?Date of Encounter: 03/17/2022 ?Client to clinic for BP monitoring. BP today 130/92, he reports taking his medication this morning.  ?Will continue to monitor for medication effectiveness each day client comes to clinic. ?He is about to start work tomorrow and will not be able to come daily. Education regarding importance of taking BP medication on a regular basis, client voiced understanding. ?Past Medical History: ?Past Medical History:  ?Diagnosis Date  ? Alcohol abuse 03/03/2020  ? Hypertension   ? ? ?Encounter Details: ? CNP Questionnaire - 03/17/22 0928   ? ?  ? Questionnaire  ? Do you give verbal consent to treat you today? Yes   ? Location Patient [place]   ? Visit Setting Church or Organization   ? Patient Status Unknown   client reports sleeping in a "house down the street"  ? Insurance Uninsured ([employer] Card/[employer] Connects/Self-Pay)   ? Insurance Referral N/A   ? Medication N/A   Medication supplied by Medication Management  ? Medical Provider Yes   new patient at [facility]  ? Screening Referrals N/A   ? Medical Referral N/A   [facility]  ? Medical Appointment Made N/A   client has follow up apt for Bp monitoring at [facility] nbext month  ? Food N/A   ? Transportation N/A   client uses the [place] bus system  ? Housing/Utilities No permanent housing   client reports living in a house near the clinic  ? Interpersonal Safety N/A   ? Intervention Support;Blood pressure;Educate   Apt made at [facility]  ? ED Visit Averted N/A   RN removed clients sutures so he did not ahve to return to the ED for removal  ? Life-Saving Intervention Made N/A   ? ?  ?  ? ?  ? ? ? ? ?

## 2022-03-18 NOTE — Congregational Nurse Program (Signed)
?  Dept: [phone] ? ? ?Congregational Nurse Program Note ? ?Date of Encounter: 03/18/2022 ?Client to clinic for blood pressure check. BP elevated again today at 160/100. He reports having taken his BP medication this morning. He denied any headache. Weakness or dizziness. He does have a follow up appointment at [facility] on 4/19. RN to continue to monitor for effectiveness of prescribed medication. ?Past Medical History: ?Past Medical History:  ?Diagnosis Date  ? Alcohol abuse 03/03/2020  ? Hypertension   ? ? ?Encounter Details: ? CNP Questionnaire - 03/18/22 0845   ? ?  ? Questionnaire  ? Do you give verbal consent to treat you today? Yes   ? Location Patient Served  [facility]   ? Visit Setting [employer] or Organization   ? Patient Status Unknown   client reports sleeping in a "house down the street"  ? Insurance Uninsured ([employer] Card/[employer] Connects/Self-Pay)   ? Insurance Referral N/A   ? Medication N/A   Medication supplied by Medication Management  ? Medical Provider Yes   Client has follow up appointment on 4/19 [facility]  ? Screening Referrals N/A   ? Medical Referral N/A   [facility]  ? Medical Appointment Made N/A   client has follow up apt for Bp monitoring at [facility] nbext month  ? Food N/A   ? Transportation N/A   client uses the [place] bus system  ? Housing/Utilities No permanent housing   client reports living in a house near the clinic  ? Interpersonal Safety N/A   ? Intervention Support;Blood pressure;Educate   Apt made at [facility]  ? ED Visit Averted N/A   RN removed clients sutures so he did not ahve to return to the ED for removal  ? Life-Saving Intervention Made N/A   ? ?  ?  ? ?  ? ? ? ? ?

## 2022-03-23 NOTE — Congregational Nurse Program (Signed)
?  Dept: [phone] ? ? ?Congregational Nurse Program Note ? ?Date of Encounter: 03/23/2022 ?Client to clinic for blood pressure check. He reports taking his amlodipine regularly. ?BP (!) 148/82 (BP Location: Left Arm, Patient Position: Sitting, Cuff Size: Large)   Pulse 88   SpO2 96%  ?Client agreeable to continue daily BP checks.  ?Past Medical History: ?Past Medical History:  ?Diagnosis Date  ? Alcohol abuse 03/03/2020  ? Hypertension   ? ? ?Encounter Details: ? CNP Questionnaire - 03/23/22 1233   ? ?  ? Questionnaire  ? Do you give verbal consent to treat you today? Yes   ? Location Patient Served  [facility]   ? Visit Setting Church or Organization   ? Patient Status Unknown   client reports sleeping in a "house down the street"  ? Insurance Uninsured ([employer] Card/[employer] Connects/Self-Pay)   ? Insurance Referral N/A   ? Medication N/A   Medication supplied by Medication Management  ? Medical Provider Yes   Client has follow up appointment on [facility]/19 [facility]  ? Screening Referrals N/A   ? Medical Referral N/A   [facility]  ? Medical Appointment Made N/A   follow up appointment on 4/19 at [facility]  ? Food N/A   ? Transportation N/A   client uses the [place] bus system  ? Housing/Utilities No permanent housing   client reports living in a house near the clinic  ? Interpersonal Safety N/A   ? Intervention Support;Blood pressure;Educate   Apt made at [facility]  ? ED Visit Averted N/A   RN removed clients sutures so he did not ahve to return to the ED for removal  ? Life-Saving Intervention Made N/A   ? ?  ?  ? ?  ? ? ? ? ?

## 2022-03-25 NOTE — Congregational Nurse Program (Signed)
?  Dept: [phone] ? ? ?Congregational Nurse Program Note ? ?Date of Encounter: 03/25/2022 ?Client to center for continued BP monitoring. ?BP 138/76 (BP Location: Left Arm, Patient Position: Sitting, Cuff Size: Large)   Pulse 90   SpO2 95%  ?No new concerns. ?Past Medical History: ?Past Medical History:  ?Diagnosis Date  ? Alcohol abuse 03/03/2020  ? Hypertension   ? ? ?Encounter Details: ? CNP Questionnaire - 03/25/22 0930   ? ?  ? Questionnaire  ? Do you give verbal consent to treat you today? Yes   ? Location Patient Served  [facility]   ? Visit Setting Church or Organization   ? Patient Status Unknown   client reports sleeping in a "house down the street"  ? Insurance Uninsured ([employer] Card/[employer] Connects/Self-Pay)   ? Insurance Referral N/A   ? Medication N/A   Medication supplied by Medication Management  ? Medical Provider Yes   Client has follow up appointment on [facility]  ? Screening Referrals N/A   ? Medical Referral N/A   [facility]  ? Medical Appointment Made N/A   follow up appointment on 4/19 at [facility]  ? Food N/A   ? Transportation N/A   client uses the [place] bus system  ? Housing/Utilities No permanent housing   client reports living in a house near the clinic  ? Interpersonal Safety N/A   ? Intervention Support;Blood pressure;Educate   Apt made at [facility]  ? ED Visit Averted N/A   RN removed clients sutures so he did not ahve to return to the ED for removal  ? Life-Saving Intervention Made N/A   ? ?  ?  ? ?  ? ? ? ? ?

## 2022-03-30 NOTE — Congregational Nurse Program (Signed)
?  Dept: [phone] ? ? ?Congregational Nurse Program Note ? ?Date of Encounter: 03/30/2022 ?Client to center for continued blood pressure monitoring. He reports taking hi medication daily as directed. ?BP 136/78 (BP Location: Left Leg, Patient Position: Sitting, Cuff Size: Large)   Pulse 86   Wt 202 lb (91.6 kg)   SpO2 95%   BMI 28.98 kg/m?Marland Kitchen ?No new concerns at this time. Client has a follow up appointment at [facility] on 4/19. ? ?Past Medical History: ?Past Medical History:  ?Diagnosis Date  ? Alcohol abuse 03/03/2020  ? Hypertension   ? ? ?Encounter Details: ? CNP Questionnaire - 03/30/22 1130   ? ?  ? Questionnaire  ? Do you give verbal consent to treat you today? Yes   ? Location Patient Served  [facility]   ? Visit Setting [employer] or Organization   ? Patient Status Unknown   client reports sleeping in a "house down the street"  ? Insurance Uninsured ([employer] Card/[employer] Connects/Self-Pay)   ? Insurance Referral N/A   ? Medication N/A   Medication supplied by Med[facility] Management  ? Medical Provider Yes   Client has follow up appointment on 4/19 [facility]  ? Screening Referrals N/A   ? Medical Referral N/A   [facility]  ? Medical Appointment Made N/A   follow up appointment on 4/19 at [facility]  ? Food N/A   ? Transportation N/A   client uses the [place] bus system  ? Housing/Utilities No permanent housing   client reports living in a house near the clinic  ? Interpersonal Safety N/A   ? Intervention Support;Blood pressure;Educate   Apt made at [facility]  ? ED Visit Averted N/A   RN removed clients sutures so he did not ahve to return to the ED for removal  ? Life-Saving Intervention Made N/A   ? ?  ?  ? ?  ? ? ? ? ?

## 2022-04-01 NOTE — Congregational Nurse Program (Signed)
?  Dept: [phone] ? ? ?Congregational Nurse Program Note ? ?Date of Encounter: 04/01/2022 ?Client to clinic for continued blood pressure monitoring. He is currently taking Amlodipine 5 mg daily. ?BP (!) 142/88 (BP Location: Left Arm, Patient Position: Sitting, Cuff Size: Large)   Pulse 77   SpO2 100%  ?Follow up appointment at [facility] on 4/19 at 12:30. ?Past Medical History: ?Past Medical History:  ?Diagnosis Date  ? Alcohol abuse 03/03/2020  ? Hypertension   ? ? ?Encounter Details: ? CNP Questionnaire - 04/01/22 0900   ? ?  ? Questionnaire  ? Do you give verbal consent to treat you today? Yes   ? Location Patient Served  [facility]   ? Visit [facility] or [facility]   ? Patient Status Unknown   client reports sleeping in a "house down the street"  ? Insurance Uninsured ([employer] [employer]s/Self-Pay)   ? Insurance Referral N/A   ? Medication N/A   Medication supplied by [facility]  ? Medical Provider Yes   Client has follow up appointment on 4/19 at 12:30 [facility]  ? Screening Referrals N/A   ? Medical Referral N/A   [facility]  ? Medical Appointment Made N/A   follow up appointment on 4/19 at [facility]  ? Food N/A   ? Transportation N/A   client uses the [place] bus system  ? Housing/Utilities No permanent housing   client reports living in a house near the clinic  ? Interpersonal Safety N/A   ? Intervention Support;Blood pressure;Educate   Apt made at [facility]  ? ED Visit Averted N/A   RN removed clients sutures so he did not ahve to return to the ED for removal  ? Life-Saving Intervention Made N/A   ? ?  ?  ? ?  ? ? ? ? ?

## 2022-04-06 NOTE — Congregational Nurse Program (Signed)
?  Dept: [phone] ? ? ?Congregational Nurse Program Note ? ?Date of Encounter: 04/06/2022 ?Client to center for continued blood pressure monitoring. He reports taking his medication as directed, BP remains high. ?BP (!) 152/110 (BP Location: Left Arm, Patient Position: Sitting, Cuff Size: Large)   Pulse 78   SpO2 96%  ?He has a follow up appointment at [facility] tomorrow at 12:30 and does plan to attend this. Again discussed the importance of taking his medication daily, which he says he is. Support given. ?Past Medical History: ?Past Medical History:  ?Diagnosis Date  ? Alcohol abuse 03/03/2020  ? Hypertension   ? ? ?Encounter Details: ? ? ? ? ?

## 2022-04-07 ENCOUNTER — Ambulatory Visit: Payer: Self-pay | Admitting: Gerontology

## 2022-04-07 NOTE — Congregational Nurse Program (Signed)
?  Dept: [phone] ? ? ?Congregational Nurse Program Note ? ?Date of Encounter: 04/07/2022 ?Client to clinic for blood pressure check. He has a follow up appointment at [facility] today. He reports taking his medication correctly, but BP remains elevated when checked. Educated client again on the importance of taking BP medication correctly. ?BP (!) 148/82 (BP Location: Left Arm, Patient Position: Sitting, Cuff Size: Large)  ?Client confirmed he would be at his 12:30 [facility] appointment today. ?Past Medical History: ?Past Medical History:  ?Diagnosis Date  ? Alcohol abuse 03/03/2020  ? Hypertension   ? ? ?Encounter Details: ? CNP Questionnaire - 04/07/22 1203   ? ?  ? Questionnaire  ? Do you give verbal consent to treat you today? Yes   ? Location Patient Served  [facility] [facility]   ? Visit [facility] [employer] or [facility]   ? Patient Status Unknown   client reports sleeping in a "house down the street"  ? Insurance Uninsured ([place] [employer]s/Self-Pay)   ? Insurance Referral N/A   ? Medication N/A   Medication supplied by [facility]  ? Medical Provider Yes   Client has follow up appointment on 4/19 at 12:30 [facility]  ? Screening Referrals N/A   ? Medical Referral N/A   [facility]  ? Medical Appointment Made N/A   follow up appointment on 4/19 at12:30 at [facility], reminded client of appointment today  ? Food N/A   ? Transportation N/A   client uses the [place] bus system  ? Housing/Utilities No permanent housing   client reports living in a house near the clinic  ? Interpersonal Safety N/A   ? Intervention Support;Blood pressure;Educate   Apt made at [facility]  ? ED Visit Averted N/A   RN removed clients sutures so he did not ahve to return to the ED for removal  ? Life-Saving Intervention Made N/A   ? ?  ?  ? ?  ? ? ? ? ?

## 2022-04-08 ENCOUNTER — Other Ambulatory Visit: Payer: Self-pay

## 2022-04-08 ENCOUNTER — Other Ambulatory Visit: Payer: Self-pay | Admitting: Gerontology

## 2022-04-08 DIAGNOSIS — I10 Essential (primary) hypertension: Secondary | ICD-10-CM

## 2022-04-08 MED FILL — Amlodipine Besylate Tab 5 MG (Base Equivalent): ORAL | 30 days supply | Qty: 30 | Fill #0 | Status: CN

## 2022-04-08 NOTE — Congregational Nurse Program (Signed)
?  Dept: [phone] ? ? ?Congregational Nurse Program Note ? ?Date of Encounter: 04/08/2022 ?Client to clinic for blood pressure check. He missed his appointment at the [facility] clinic yesterday. New appointment scheduled for 4/26 at 2 pm. ?Client reports he will be out of his Amlodipine before this appointment and has not refills.  ?RN contacted [facility] for refill request. RN to follow up. Client aware ?Past Medical History: ?Past Medical History:  ?Diagnosis Date  ? Alcohol abuse 03/03/2020  ? Hypertension   ? ? ?Encounter Details: ? CNP Questionnaire - 04/08/22 1000   ? ?  ? Questionnaire  ? Do you give verbal consent to treat you today? Yes   ? Location Patient Served  [facility] [facility]   ? Visit [facility] [employer] or [facility]   ? Patient Status Unknown   client reports sleeping in a "house down the street"  ? Insurance Uninsured ([employer] Card/[employer]s/Self-Pay)   ? Insurance Referral N/A   ? Medication Have Medication Insecurities   Called [facility] for refill on Norvasc 5 mg  ? Medical Provider Yes   Client has follow up appointment on 4/19 at 12:30 [facility]. Client missed this appointment, rescheduled for 4/26 at 2 pm  ? Screening Referrals N/A   ? Medical Referral N/A   [facility]  ? Medical Appointment Made N/A   follow up appointment on 4/19 at12:30 at [facility], reminded client of appointment today  ? Food N/A   ? Transportation N/A   client uses the [place] bus system  ? Housing/Utilities No permanent housing   client reports living in a house near the clinic  ? Interpersonal Safety N/A   ? Intervention Support;Blood pressure;Educate;Case Management   Apt made at [facility]  ? ED Visit Averted N/A   RN removed clients sutures so he did not ahve to return to the ED for removal  ? Life-Saving Intervention Made N/A   ? ?  ?  ? ?  ? ? ? ? ?

## 2022-04-09 ENCOUNTER — Other Ambulatory Visit: Payer: Self-pay

## 2022-04-12 ENCOUNTER — Ambulatory Visit: Payer: Self-pay | Admitting: Pharmacy Technician

## 2022-04-12 ENCOUNTER — Other Ambulatory Visit: Payer: Self-pay

## 2022-04-12 DIAGNOSIS — Z79899 Other long term (current) drug therapy: Secondary | ICD-10-CM

## 2022-04-12 MED FILL — Amlodipine Besylate Tab 5 MG (Base Equivalent): ORAL | 30 days supply | Qty: 30 | Fill #0 | Status: AC

## 2022-04-12 NOTE — Progress Notes (Signed)
Completed [facility] application and contract.  Patient agreed to all terms of the [facility] contract.    Patient approved to receive medication assistance at [facility] until time for re-certification and as long as eligibility criteria continues to be met.    Provided patient with community resource material based on his particular needs.    W[facility]ley Doyle [name] Care Manager [facility]  

## 2022-04-13 ENCOUNTER — Other Ambulatory Visit: Payer: Self-pay

## 2022-04-13 NOTE — Congregational Nurse Program (Signed)
?  Dept: [phone] ? ? ?Congregational Nurse Program Note ? ?Date of Encounter: 04/13/2022 ?Client to clinic for blood pressure check. Amlodipine prescription was refilled at [facility], client now has. Reminded client of upcoming appointment at [facility] tomorrow at 2 pm. He voiced understanding. He does plan to take the [facility]. ?BP (!) 150/82 (BP Location: Left Arm, Patient Position: Sitting, Cuff Size: Large)   Pulse 71   SpO2 99% Comment: room air ? ?Past Medical History: ?Past Medical History:  ?Diagnosis Date  ? Alcohol abuse 03/03/2020  ? Hypertension   ? ? ?Encounter Details: ? CNP Questionnaire - 04/13/22 1005   ? ?  ? Questionnaire  ? Do you give verbal consent to treat you today? Yes   ? Location Patient Served  [facility] [facility]   ? Visit [facility] [employer] or [facility]   ? Patient Status Unknown   client reports sleeping in a "house down the street"  ? Insurance Uninsured ([employer] Card/[employer]s/Self-Pay)   ? Insurance Referral N/A   ? Medication Have Medication Insecurities   Norvasc 5 mg refilled at [facility] and given to client  ? Medical Provider Yes   Client has follow up appointment on 4/19 at 12:30 [facility]. Client missed this appointment, rescheduled for 4/26 at 2 pm  ? Screening Referrals N/A   ? Medical Referral N/A   [facility]  ? Medical Appointment Made N/A   client missed last [facility] apt on 4/19, new apt on 4/26 at 2 pm  ? Food N/A   ? Transportation N/A   client uses the [place] bus system  ? Housing/Utilities No permanent housing   client reports living in a house near the clinic  ? Interpersonal Safety N/A   ? Intervention Support;Blood pressure;Educate   Apt made at [facility]  ? ED Visit Averted N/A   RN removed clients sutures so he did not ahve to return to the ED for removal  ? Life-Saving Intervention Made N/A   ? ?  ?  ? ?  ? ? ? ? ?

## 2022-04-14 ENCOUNTER — Ambulatory Visit: Payer: Self-pay | Admitting: Gerontology

## 2022-04-15 NOTE — Congregational Nurse Program (Signed)
?  Dept: [phone] ? ? ?Congregational Nurse Program Note ? ?Date of Encounter: 04/15/2022 ?Client to clinic for blood pressure check. He did not attend his [facility] apt yesterday. Phone number given to client for the [facility] clinic, he will make a follow up appointment on his own. RN stressed the importance of the follow up appointment for continued blood pressure medication. Client voiced understanding. ?Past Medical History: ?Past Medical History:  ?Diagnosis Date  ? Alcohol abuse 03/03/2020  ? Hypertension   ? ? ?Encounter Details: ? CNP Questionnaire - 04/15/22 1034   ? ?  ? Questionnaire  ? Do you give verbal consent to treat you today? Yes   ? Location Patient [facility]   ? Visit [facility] [employer] or [facility]   ? Patient Status Unknown   client reports sleeping in a "house down the street"  ? Insurance Uninsured ([employer] Card/[employer] Connects/Self-Pay)   ? Insurance Referral N/A   ? Medication N/A   medicationis provided by [facility]  ? Medical Provider Yes   Client has follow up appointment on 4/19 at 12:30 [facility]. Client missed this appointment, rescheduled for 4/26 at 2 pm  ? Screening Referrals N/A   ? Medical Referral N/A   [facility]  ? Medical Appointment Made N/A   client missed 4/26 apt. Rn gave [facility] number to client for him to make an apt.  ? Food N/A   ? Transportation N/A   client uses the [place] bus system  ? Housing/Utilities No permanent housing   client reports living in a house near the clinic  ? Interpersonal Safety N/A   ? Intervention Support;Blood pressure;Educate   Apt made at [facility]  ? ED Visit Averted N/A   RN removed clients sutures so he did not ahve to return to the ED for removal  ? Life-Saving Intervention Made N/A   ? ?  ?  ? ?  ? ? ? ? ?

## 2022-04-15 NOTE — Congregational Nurse Program (Signed)
?  Dept: [phone] ? ? ?Congregational Nurse Program Note ? ?Date of Encounter: 04/15/2022 ?Duplicate encounter, see previous note ?Past Medical History: ?Past Medical History:  ?Diagnosis Date  ? Alcohol abuse 03/03/2020  ? Hypertension   ? ? ?Encounter Details: ? CNP Questionnaire - 04/15/22 1137   ? ?  ? Questionnaire  ? Do you give verbal consent to treat you today? Yes   ? Location Patient [place]   ? Visit [facility] [employer] or [facility]   ? Patient Status Unknown   client reports sleeping in a "house down the street"  ? Insurance Uninsured ([employer] Card/[employer] Connects/Self-Pay)   ? Insurance Referral N/A   ? Medication N/A   medicationis provided by [facility]  ? Medical Provider Yes   Client has follow up appointment on 4/19 at 12:30 [facility]. Client missed this appointment, rescheduled for 4/26 at 2 pm  ? Screening Referrals N/A   ? Medical Referral N/A   [facility]  ? Medical Appointment Made N/A   client missed 4/26 apt. Rn gave [facility] number to client for him to make an apt.  ? Food N/A   ? Transportation N/A   client uses the [place] bus system  ? Housing/Utilities No permanent housing   client reports living in a house near the clinic  ? Interpersonal Safety N/A   ? Intervention Support;Blood pressure;Educate   Apt made at [facility]  ? ED Visit Averted N/A   RN removed clients sutures so he did not ahve to return to the ED for removal  ? Life-Saving Intervention Made N/A   ? ?  ?  ? ?  ? ? ? ? ?

## 2022-04-15 NOTE — Congregational Nurse Program (Signed)
?  Dept: [phone] ? ? ?Congregational Nurse Program Note ? ?Date of Encounter: 04/14/2022 ?Client to clinic for blood pressure check. He has had his Amlodipine refilled and reports taking it. BP today 117/68. ?He has a follow up appointment today at the [facility] at 2 pm. Client does plan to go, and will take the [facility] bus. ?Past Medical History: ?Past Medical History:  ?Diagnosis Date  ? Alcohol abuse 03/03/2020  ? Hypertension   ? ? ?Encounter Details: ? CNP Questionnaire - 04/14/22 1030   ? ?  ? Questionnaire  ? Do you give verbal consent to treat you today? Yes   ? Location Patient Served  [facility]   ? Visit [facility] [employer] or [facility]   ? Patient Status Unknown   client reports sleeping in a "house down the street"  ? Insurance Uninsured ([employer] Card/[employer] Connects/Self-Pay)   ? Insurance Referral N/A   ? Medication Have Medication Insecurities   Norvasc 5 mg refilled at [facility] and given to client  ? Medical Provider Yes   Client has follow up appointment on 4/19 at 12:30 [facility]. Client missed this appointment, rescheduled for 4/26 at 2 pm  ? Screening Referrals N/A   ? Medical Referral N/A   [facility]  ? Medical Appointment Made N/A   client missed last [facility] apt on 4/19, new apt on 4/26 at 2 pm  ? Food N/A   ? Transportation N/A   client uses the [place] bus system  ? Housing/Utilities No permanent housing   client reports living in a house near the clinic  ? Interpersonal Safety N/A   ? Intervention Support;Blood pressure;Educate   Apt made at [facility]  ? ED Visit Averted N/A   RN removed clients sutures so he did not ahve to return to the ED for removal  ? Life-Saving Intervention Made N/A   ? ?  ?  ? ?  ? ? ? ? ?

## 2022-04-21 NOTE — Congregational Nurse Program (Signed)
?  Dept: [phone] ? ? ?Congregational Nurse Program Note ? ?Date of Encounter: 04/21/2022 ?Client to clinic for BP check. He has not made another [facility] appointment. RN stressed the importance of follow up so that he can continue to get his medication refills. Cleint voiced understanding. ?BP 138/88 (BP Location: Left Arm, Patient Position: Sitting, Cuff Size: Large)   Pulse (!) 107   SpO2 95%  ? ?Past Medical History: ?Past Medical History:  ?Diagnosis Date  ? Alcohol abuse 03/03/2020  ? Hypertension   ? ? ?Encounter Details: ? CNP Questionnaire - 04/21/22 1053   ? ?  ? Questionnaire  ? Do you give verbal consent to treat you today? Yes   ? Location Patient Served  [facility]   ? Visit [facility] [employer] or [facility]   ? Patient Status Unknown   client reports sleeping in a "house down the street"  ? Insurance Uninsured ([employer] Card/[employer] Connects/Self-Pay)   ? Insurance Referral N/A   ? Medication N/A   medicationis provided by [facility]  ? Medical Provider Yes   Client has follow up appointment on 4/19 at 12:30 at  [facility]. Client missed this appointment, rescheduled for 4/26 at 2 pm  ? Screening Referrals N/A   ? Medical Referral N/A   [facility]  ? Medical Appointment Made N/A   client missed 4/26 apt. Rn gave [facility] number to client for him to make an apt.  ? Food N/A   ? Transportation N/A   client uses the [place] bus system  ? Housing/Utilities No permanent housing   client reports living in a house near the clinic  ? Interpersonal Safety N/A   ? Intervention Support;Blood pressure;Educate   Apt made at [facility]  ? ED Visit Averted N/A   RN removed clients sutures so he did not ahve to return to the ED for removal  ? Life-Saving Intervention Made N/A   ? ?  ?  ? ?  ? ? ? ? ?

## 2022-04-22 ENCOUNTER — Other Ambulatory Visit: Payer: Self-pay

## 2022-04-22 NOTE — Congregational Nurse Program (Signed)
?  Dept: [phone] ? ? ?Congregational Nurse Program Note ? ?Date of Encounter: 04/22/2022 ?Client to clinic for blood pressure check. BP elevated again today. ?BP (!) 140/100 (BP Location: Left Arm, Patient Position: Sitting, Cuff Size: Large)   Pulse 81   SpO2 96% Comment: room air. ?Client continues to report he is taking his medication daily. He has not rescheduled his [facility] follow up appointment. RN encouraged him to do so. ? ?Past Medical History: ?Past Medical History:  ?Diagnosis Date  ? Alcohol abuse 03/03/2020  ? Hypertension   ? ? ?Encounter Details: ? CNP Questionnaire - 04/22/22 1108   ? ?  ? Questionnaire  ? Do you give verbal consent to treat you today? Yes   ? Location Patient Served  [facility]   ? Visit [facility] [employer] or [facility]   ? Patient Status Unknown   client reports sleeping in a "house down the street"  ? Insurance Uninsured ([employer] Card/[employer] Connects/Self-Pay)   ? Insurance Referral N/A   ? Medication N/A   medicationis provided by [facility]  ? Medical Provider Yes   Client has follow up appointment on 4/19 at 12:30 at  [facility]. Client missed this appointment, rescheduled for 4/26 at 2 pm. Client did not go to apt and has not rescheduled as of today.  ? Screening Referrals N/A   ? Medical Referral Other   [facility]  ? Medical Appointment Made N/A   client missed 4/26 apt. Rn gave [facility] number to client for him to make an apt.  ? Food N/A   ? Transportation N/A   client uses the [place] bus system  ? Housing/Utilities No permanent housing   client reports living in a house near the clinic  ? Interpersonal Safety N/A   ? Intervention Support;Blood pressure;Educate   Apt made at [facility]  ? ED Visit Averted N/A   RN removed clients sutures so he did not ahve to return to the ED for removal  ? Life-Saving Intervention Made N/A   ? ?  ?  ? ?  ? ? ? ? ?

## 2022-05-04 NOTE — Congregational Nurse Program (Signed)
?  Dept: [phone] ? ? ?Congregational Nurse Program Note ? ?Date of Encounter: 05/04/2022 ?Cleint to clinic for blood pressure check.  ?BP (!) 142/78 (BP Location: Left Arm, Patient Position: Sitting, Cuff Size: Large)   Pulse 78   SpO2 96% Comment: room air ?RN provided education regarding importance of BP control. He reports he is still taking his medication as directed. RN also stressed the importance of a follow apt at the [facility]. He has missed his last 2 appointments. He has the number and does plan to call. ?Past Medical History: ?Past Medical History:  ?Diagnosis Date  ? Alcohol abuse 03/03/2020  ? Hypertension   ? ? ?Encounter Details: ? CNP Questionnaire - 05/04/22 1105   ? ?  ? Questionnaire  ? Do you give verbal consent to treat you today? Yes   ? Location Patient [place]   ? Visit [facility] [employer] [facility]   ? Patient Status Unknown   client reports sleeping in a "house down the street"  ? Insurance Uninsured ([employer] Card/[employer] Connects/Self-Pay)   ? Insurance Referral N/A   ? Medication N/A   medicationis provided by [facility]  ? Medical Provider Yes   Client has follow up appointment on 4/19 at 12:30 at  [facility]. Client missed this appointment, rescheduled for 4/26 at 2 pm. Client did not go to apt and has not rescheduled as of today.  ? Screening Referrals N/A   ? Medical Referral N/A   ? Medical Appointment Made N/A   Reminded client again to make a follow up appointment at [facility]. He missed his last 2 that RN made for him  ? Food N/A   ? Transportation N/A   client uses the [place] bus system  ? Housing/Utilities No permanent housing   client reports living in a house near the clinic  ? Interpersonal Safety N/A   ? Intervention Support;Blood pressure;Educate   Apt made at [facility]  ? ED Visit Averted N/A   RN removed clients sutures so he did not ahve to return to the ED for removal  ? Life-Saving Intervention Made N/A   ? ?  ?  ? ?  ? ? ? ? ?

## 2022-05-11 NOTE — Congregational Nurse Program (Signed)
  Dept: [phone]   Congregational Nurse Program Note  Date of Encounter: 05/11/2022 Client to clinic for blood pressure check. BP (!) 148/76 (BP Location: Left Arm, Patient Position: Sitting, Cuff Size: Large)   Pulse 86   SpO2 95%  He again reports he is taking his medication as directed. He has not made another apt at [facility] for follow up. RN again stressed the importance of a follow up appointment at [facility]. Client voiced understanding. Past Medical History: Past Medical History:  Diagnosis Date   Alcohol abuse 03/03/2020   Hypertension     Encounter Details:  CNP Questionnaire - 05/11/22 1311       Questionnaire   Do you give verbal consent to treat you today? Yes    Location Patient [place] or Organization    Patient Status Unknown   client reports sleeping in a "house down the street"   Insurance Uninsured ([place] Card/Care Connects/Self-Pay)    Insurance Referral N/A    Medication N/A   medicationis provided by [facility]   Medical Provider Yes   Client has follow up appointment on 4/19 at 12:30 at  [facility]. Client missed this appointment, rescheduled for 4/26 at 2 pm. Client did not go to apt and has not rescheduled as of today.   Screening Referrals N/A    Medical Referral N/A    Medical Appointment Made N/A   Reminded client again to make a follow up appointment at [facility]. He missed his last 2 that RN made for him   Food N/A    Transportation N/A   client uses the [facility] system   Housing/Utilities No permanent housing   client reports living in a house near the clinic   Interpersonal Safety N/A    Intervention Support;Blood pressure;Educate   Apt made at [facility]   ED Visit Averted N/A   RN removed clients sutures so he did not ahve to return to the ED for removal   Life-Saving Intervention Made N/A

## 2022-05-19 NOTE — Congregational Nurse Program (Signed)
  Dept: [phone]   Congregational Nurse Program Note  Date of Encounter: 05/19/2022 Client to clinic for blood pressure check. Encouraged him to make another appointment at [facility] to be able to continue his blood pressure medication. Phone number given. No other concerns at this time. Past Medical History: Past Medical History:  Diagnosis Date   Alcohol abuse 03/03/2020   Hypertension     Encounter Details:  CNP Questionnaire - 05/19/22 1122       Questionnaire   Do you give verbal consent to treat you today? Yes    Location Patient Served  [facility]    Visit [facility] [employer] [facility]    Patient Status Unknown   client reports sleeping in a "house down the street"   Insurance Uninsured ([employer] Card/Care Connects/Self-Pay)    Insurance Referral N/A    Medication N/A   medicationis provided by Medication [facility]   Medical Provider Yes   Client has follow up appointment on 4/19 at 12:30 at  [facility]. Client missed this appointment, rescheduled for 4/26 at 2 pm. Client did not go to apt and has not rescheduled as of today.   Screening Referrals N/A    Medical Referral N/A    Medical Appointment Made N/A   Reminded client again to make a follow up appointment at [facility]. He missed his last 2 that RN made for him   Food N/A    Transportation N/A   client uses the [facility] system   Housing/Utilities No permanent housing   client reports living in a house near the clinic   Interpersonal Safety N/A    Intervention Support;Blood pressure;Educate   Apt made at [facility]   ED Visit Averted N/A   RN removed clients sutures so he did not ahve to return to the ED for removal   Life-Saving Intervention Made N/A

## 2022-05-26 NOTE — Congregational Nurse Program (Signed)
  Dept: [phone]   Congregational Nurse Program Note  Date of Encounter: 05/26/2022 Client to clinic for blood pressure check. BP 122/78 (BP Location: Left Arm, Patient Position: Sitting, Cuff Size: Large)   Pulse 78   SpO2 98%  Unclear if he had taken his medication this morning. Encouraged him to make another appointment at [facility]. He hs not gone to the last 2 appointments RN had set up. He is aware he will need an appointment so he can get his medication refilled. RN will continue to provide encouragement for follow up. Past Medical History: Past Medical History:  Diagnosis Date   Alcohol abuse 03/03/2020   Hypertension     Encounter Details:  CNP Questionnaire - 05/26/22 1241       Questionnaire   Do you give verbal consent to treat you today? Yes    Location Patient Served  [facility]    Visit Setting Church or Organization    Patient Status Unknown   client reports sleeping in a "house down the street"   Insurance Uninsured ([employer]/Self-Pay)    Insurance Referral N/A    Medication N/A   medicationis provided by [facility]ication [facility]   Medical Provider Yes   Client has follow up appointment on 4/19 at 12:30 at  [facility]. Client missed this appointment, rescheduled for 4/26 at 2 pm. Client did not go to apt and has not rescheduled as of today.   Screening Referrals N/A    Medical Referral N/A    Medical Appointment Made N/A   Reminded client again to make a follow up appointment at [facility]. He missed his last 2 that RN made for him   Food N/A    Transportation N/A   client uses the [employer] bus system   Housing/Utilities No permanent housing   client reports living in a house near the clinic   Interpersonal Safety N/A    Intervention Support;Blood pressure;Educate   Apt made at [facility]   ED Visit Averted N/A   RN removed clients sutures so he did not ahve to return to the ED for removal   Life-Saving Intervention Made N/A

## 2022-07-13 NOTE — Congregational Nurse Program (Signed)
  Dept: [phone]   Congregational Nurse Program Note  Date of Encounter: 07/13/2022 Client to clinic for vital sign check. He reports he is not regularly taking his BP medications. BP (!) 158/92 (BP Location: Left Arm, Patient Position: Sitting, Cuff Size: Large)   Pulse 84   Wt 190 lb (86.2 kg)   SpO2 96%   BMI 27.26 kg/m . RN stressed the importance of blood pressure control and risks involved. Client voiced understanding. Encouraged him to continue to take his medication and have his BP checked regularly here at the clinic.  Past Medical History: Past Medical History:  Diagnosis Date   Alcohol abuse 03/03/2020   Hypertension     Encounter Details:  CNP Questionnaire - 07/13/22 1151       Questionnaire   Do you give verbal consent to treat you today? Yes    Location Patient Served  [facility]    Visit Setting Church or Organization    Patient Status Unknown   client reports sleeping in a "house down the street"   Insurance Uninsured ([employer]/Self-Pay)    Insurance Referral N/A    Medication N/A   medicationis provided by Medication managmement   Medical Provider Yes   Client has follow up appointment on 4/19 at 12:30 at  [facility]. Client missed this appointment, rescheduled for 4/26 at 2 pm. Client did not go to apt and has not rescheduled as of today.   Screening Referrals N/A    Medical Referral N/A    Medical Appointment Made N/A   Reminded client again to make a follow up appointment at [facility]. He missed his last 2 that RN made for him   Food N/A    Transportation N/A   client uses the [employer] bus system or friends provide rides   Housing/Utilities No permanent housing   client reports living in a house near the clinic   Interpersonal Safety N/A    Intervention Support;Blood pressure;Educate   Apt made at [facility]   ED Visit Averted N/A   RN removed clients sutures so he did not ahve to return to the ED for removal   Life-Saving Intervention  Made N/A

## 2022-07-14 NOTE — Congregational Nurse Program (Signed)
  Dept: [phone]   Congregational Nurse Program Note  Date of Encounter: 07/14/2022 Client to clinic for continued BP monitoring. He reports he took his mediation this morning. He has not been taking it regularly. RN continues to provide education regarding the need for and importance of blood pressure control. Client voiced understanding. Past Medical History: Past Medical History:  Diagnosis Date   Alcohol abuse 03/03/2020   Hypertension     Encounter Details:  CNP Questionnaire - 07/14/22 1030       Questionnaire   Do you give verbal consent to treat you today? Yes    Location Patient Served  [facility]    Visit Setting Church or Organization    Patient Status Unknown   client reports sleeping in a "house down the street"   Insurance Uninsured ([employer]/Self-Pay)    Insurance Referral N/A    Medication N/A   medicationis provided by Medication managmement   Medical Provider Yes   Client has follow up appointment on 4/19 at 12:30 at  [facility]. Client missed this appointment, rescheduled for 4/26 at 2 pm. Client did not go to apt and has not rescheduled as of today.   Screening Referrals N/A    Medical Referral N/A    Medical Appointment Made N/A   Reminded client again to make a follow up appointment at [facility]. He missed his last 2 that RN made for him   Food N/A    Transportation N/A   client uses the [employer] bus system or friends provide rides   Housing/Utilities No permanent housing   client reports living in a house near the clinic   Interpersonal Safety N/A    Intervention Support;Blood pressure;Educate   Apt made at [facility]   ED Visit Averted N/A   RN removed clients sutures so he did not ahve to return to the ED for removal   Life-Saving Intervention Made N/A

## 2022-07-21 NOTE — Congregational Nurse Program (Signed)
  Dept: [phone]   Congregational Nurse Program Note  Date of Encounter: 07/21/2022 Client to clinic for blood pressure check. He reports he took his BP medication today, but is not taking it regularly. RN continues to provide eduction regarding stroke risk and need for BP control, importance of taking medication regularly as directed. Client voiced understanding.  Past Medical History: Past Medical History:  Diagnosis Date   Alcohol abuse 03/03/2020   Hypertension     Encounter Details:  CNP Questionnaire - 07/21/22 0950       Questionnaire   Do you give verbal consent to treat you today? Yes    Location Patient Served  [facility]    Visit Setting Church or Organization    Patient Status Unknown   client reports sleeping in a "house down the street"   Insurance Uninsured ([employer]/Self-Pay)    Insurance Referral N/A    Medication N/A   medicationis provided by Medication managmement   Medical Provider Yes   Client has follow up appointment on 4/19 at 12:30 at  [facility]. Client missed this appointment, rescheduled for 4/26 at 2 pm. Client did not go to apt and has not rescheduled as of today.   Screening Referrals N/A    Medical Referral N/A    Medical Appointment Made N/A   Reminded client again to make a follow up appointment at [facility]. He missed his last 2 that RN made for him   Food N/A    Transportation N/A   client uses the [employer] bus system or friends provide rides   Housing/Utilities No permanent housing   client reports living in a house near the clinic   Interpersonal Safety N/A    Intervention Support;Blood pressure;Educate   Apt made at [facility]   ED Visit Averted N/A   RN removed clients sutures so he did not ahve to return to the ED for removal   Life-Saving Intervention Made N/A

## 2022-07-22 NOTE — Congregational Nurse Program (Signed)
  Dept: [phone]   Congregational Nurse Program Note  Date of Encounter: 07/22/2022 Client to clinic for blood pressure check. BP (!) 152/78 (BP Location: Left Arm, Patient Position: Sitting, Cuff Size: Large)   Pulse 78   SpO2 97%  Client admitted he had not been taking his BP medication regularly. RN again provided education and counsel about the importance of taking this medication daily for blood pressure control. Client voiced understanding. Past Medical History: Past Medical History:  Diagnosis Date   Alcohol abuse 03/03/2020   Hypertension     Encounter Details:  CNP Questionnaire - 07/22/22 1030       Questionnaire   Do you give verbal consent to treat you today? Yes    Location Patient Served  [facility]    Visit Setting Church or Organization    Patient Status Unknown   client reports sleeping in a "house down the street"   Insurance Uninsured ([employer]/Self-Pay)    Insurance Referral N/A    Medication N/A   medicationis provided by Medication managmement. RN picks up   Medical Provider Yes   Client has follow up appointment on 4/19 at 12:30 at  [facility]. Client missed this appointment, rescheduled for 4/26 at 2 pm. Client did not go to apt and has not rescheduled as of today.   Screening Referrals N/A    Medical Referral N/A    Medical Appointment Made N/A   Reminded client again to make a follow up appointment at [facility]. He missed his last 2 that RN made for him   Food N/A    Transportation N/A   client uses the [employer] bus system or friends provide rides   Housing/Utilities No permanent housing   client reports living in a house near the clinic   Interpersonal Safety N/A    Intervention Support;Blood pressure;Educate   Apt made at [facility]   ED Visit Averted N/A    Life-Saving Intervention Made N/A

## 2022-07-27 NOTE — Congregational Nurse Program (Signed)
  Dept: [phone]   Congregational Nurse Program Note  Date of Encounter: 07/27/2022 Client to clinic for blood pressure check. He reports having taken his Norvasc today and yesterday. BP 170/100. He denied headache, numbness or light headedness. RN continues to provided education and counsel regarding dangers of uncontrolled BP. Client agreeable to an appointment at [facility], he has not kept his past follow up appointments. Apt scheduled for 8/10 at 2:30 pm. RN counseled client to be honest with the practitioner about his inconsistent use of the Norvasc. Client will continue to come to clinic for BP monitoring. Past Medical History: Past Medical History:  Diagnosis Date   Alcohol abuse 03/03/2020   Hypertension     Encounter Details:  CNP Questionnaire - 07/27/22 1234       Questionnaire   Do you give verbal consent to treat you today? Yes    Location Patient Served  [facility]    Visit Setting Church or Organization    Patient Status Unknown    Insurance Uninsured ([employer]/Self-Pay)    Insurance Referral N/A    Medication N/A    Medical Provider Yes    Screening Referrals N/A    Medical Referral N/A    Medical Appointment Made Non-[facility] PCP/clinic   [facility] on 8/10 at 2:30 pm   Food N/A    Transportation N/A    Housing/Utilities No permanent housing    Interpersonal Safety N/A    Intervention Support;Blood pressure;Educate;Case Management    ED Visit Averted N/A    Life-Saving Intervention Made N/A

## 2022-07-29 ENCOUNTER — Other Ambulatory Visit: Payer: Self-pay

## 2022-07-29 ENCOUNTER — Encounter: Payer: Self-pay | Admitting: Gerontology

## 2022-07-29 ENCOUNTER — Ambulatory Visit: Payer: Self-pay | Admitting: Gerontology

## 2022-07-29 VITALS — BP 143/89 | HR 78 | Temp 98.0°F | Resp 16 | Ht 69.5 in | Wt 202.4 lb

## 2022-07-29 DIAGNOSIS — I10 Essential (primary) hypertension: Secondary | ICD-10-CM

## 2022-07-29 DIAGNOSIS — F17200 Nicotine dependence, unspecified, uncomplicated: Secondary | ICD-10-CM

## 2022-07-29 MED ORDER — AMLODIPINE BESYLATE 5 MG PO TABS
5.0000 mg | ORAL_TABLET | Freq: Every day | ORAL | 2 refills | Status: DC
  Filled 2022-07-29: qty 30, 30d supply, fill #0

## 2022-07-29 NOTE — Patient Instructions (Signed)

## 2022-07-29 NOTE — Congregational Nurse Program (Signed)
  Dept: [phone]   Congregational Nurse Program Note  Date of Encounter: 07/29/2022 Client to [facility] clinic for continued blood pressure monitoring. He does have an apt today at [facility] at 2:30, he states he will keep this appointment. RN provided continued education regarding importance of BP control. BP (!) 158/88 (BP Location: Left Arm, Patient Position: Sitting, Cuff Size: Large)   Pulse 72   SpO2 100%.  Past Medical History: Past Medical History:  Diagnosis Date   Alcohol abuse 03/03/2020   Hypertension     Encounter Details:  CNP Questionnaire - 07/29/22 1155       Questionnaire   Do you give verbal consent to treat you today? Yes    Location Patient Served  [facility]    Visit Setting Church or Organization    Patient Status Unknown   client sleeps at his sister's house most nights   Insurance Uninsured ([employer]/Self-Pay)    Insurance Referral N/A    Medication N/A    Medical Provider Yes    Screening Referrals N/A    Medical Referral N/A    Medical Appointment Made Non-[facility] PCP/clinic   [facility] on 8/10 at 2:30 pm   Food N/A    Transportation N/A    Housing/Utilities No permanent housing    Interpersonal Safety N/A    Intervention Support;Blood pressure;Educate;Case Management    ED Visit Averted N/A    Life-Saving Intervention Made N/A

## 2022-07-29 NOTE — Progress Notes (Signed)
Established Patient Office Visit  Subjective   Patient ID: Wesley Doyle, male    DOB: 05-Sep-1991  Age: 31 y.o. MRN: [id number]  Chief Complaint  Patient presents with   Follow-up   Hypertension    Patient re-started his HTN meds ~ 10 days ago.    HPI  Wesley Doyle is a 31 year old male who has history of alcohol use, hypertension and presents  for routine follow up and medication refill. He's been out of his Amlodipine for many months and blood pressure was 170/110 when measured at the [facility] clinic.  He denies chest pain, palpitation, lightheadedness and vision changes.  He continues to smoke half pack of cigarette daily and admits the desire to quit. His potassium was 2.6 mmol/L 5 months ago and he did not follow up with his appointments. Overall, he states that he is doing well and offers no further complaint.  Review of Systems  Constitutional: Negative.   Eyes: Negative.   Respiratory: Negative.    Cardiovascular: Negative.   Neurological: Negative.   Psychiatric/Behavioral: Negative.        Objective:     BP (!) 143/89 (BP Location: Right Arm, Patient Position: Sitting, Cuff Size: Large)   Pulse 78   Temp 98 F (36.7 C) (Oral)   Resp 16   Ht 5' 9.5" (1.765 m)   Wt 202 lb 6.4 oz (91.8 kg)   SpO2 96%   BMI 29.46 kg/m  BP Readings from Last 3 Encounters:  07/29/22 (!) 143/89  07/29/22 (!) 158/88  07/27/22 (!) 170/110   Wt Readings from Last 3 Encounters:  07/29/22 202 lb 6.4 oz (91.8 kg)  07/13/22 190 lb (86.2 kg)  03/30/22 202 lb (91.6 kg)      Physical Exam HENT:     Head: Normocephalic and atraumatic.     Mouth/Throat:     Mouth: Mucous membranes are moist.  Eyes:     Extraocular Movements: Extraocular movements intact.     Conjunctiva/sclera: Conjunctivae normal.     Pupils: Pupils are equal, round, and reactive to light.  Cardiovascular:     Rate and Rhythm: Normal rate and regular rhythm.     Pulses: Normal pulses.     Heart sounds:  Normal heart sounds.  Pulmonary:     Effort: Pulmonary effort is normal.     Breath sounds: Normal breath sounds.  Skin:    General: Skin is warm.  Neurological:     General: No focal deficit present.     Mental Status: He is alert and oriented to person, place, and time. Mental status is at baseline.  Psychiatric:        Mood and Affect: Mood normal.        Behavior: Behavior normal.        Thought Content: Thought content normal.        Judgment: Judgment normal.      No results found for any visits on 07/29/22.  Last CBC Lab Results  Component Value Date   WBC 6.9 02/18/2022   HGB 14.9 02/18/2022   HCT 44.7 02/18/2022   MCV 84.2 02/18/2022   MCH 28.1 02/18/2022   RDW 13.8 02/18/2022   PLT 273 02/18/2022   Last metabolic panel Lab Results  Component Value Date   GLUCOSE 138 (H) 02/18/2022   NA 144 02/18/2022   K 2.6 (LL) 02/18/2022   CL 98 02/18/2022   CO2 28 02/18/2022   BUN 6 02/18/2022   CREATININE  0.72 02/18/2022   GFRNONAA >60 02/18/2022   CALCIUM 8.9 02/18/2022   PROT 7.6 02/18/2022   ALBUMIN 3.9 02/18/2022   LABGLOB 2.9 04/30/2020   AGRATIO 1.6 04/30/2020   BILITOT 0.5 02/18/2022   ALKPHOS 62 02/18/2022   AST 34 02/18/2022   ALT 32 02/18/2022   ANIONGAP 18 (H) 02/18/2022   Last lipids Lab Results  Component Value Date   CHOL 206 (H) 04/30/2020   HDL 55 04/30/2020   LDLCALC 132 (H) 04/30/2020   TRIG 105 04/30/2020   CHOLHDL 3.7 04/30/2020   Last hemoglobin A1c Lab Results  Component Value Date   HGBA1C 5.4 04/30/2020      The ASCVD Risk score (Arnett DK, et al., 2019) failed to calculate for the following reasons:   The 2019 ASCVD risk score is only valid for ages 12 to 70    Assessment & Plan:   1. Essential hypertension -His blood pressure is not under control, his goal should be less than 140/90.  He will continue on current medication, advised to check blood pressure weekly, record and bring the log to follow-up appointment.  He  was encouraged to continue on DASH diet and exercise as tolerated.  His potassium will be rechecked. - amLODipine (NORVASC) 5 MG tablet; Take 1 tablet (5 mg total) by mouth once daily.  Dispense: 30 tablet; Refill: 2 - Basic Metabolic Panel (BMET); Future - Lipid panel; Future - Lipid panel - Basic Metabolic Panel (BMET)  2. Smoking -He was encouraged on smoking cessation and was provided with the [employer] quit line information.   Return in about 13 weeks (around 10/28/2022), or if symptoms worsen or fail to improve.    Lawarence Meek Trellis Paganini, NP

## 2022-07-30 LAB — LIPID PANEL
Chol/HDL Ratio: 2.1 ratio (ref 0.0–5.0)
Cholesterol, Total: 189 mg/dL (ref 100–199)
HDL: 88 mg/dL (ref >39)
LDL Chol Calc (NIH): 85 mg/dL (ref 0–99)
Triglycerides: 88 mg/dL (ref 0–149)
VLDL Cholesterol Cal: 16 mg/dL (ref 5–40)

## 2022-07-30 LAB — BASIC METABOLIC PANEL
BUN/Creatinine Ratio: 12 (ref 9–20)
BUN: 10 mg/dL (ref 6–20)
CO2: 26 mmol/L (ref 20–29)
Calcium: 9.3 mg/dL (ref 8.7–10.2)
Chloride: 99 mmol/L (ref 96–106)
Creatinine, Ser: 0.81 mg/dL (ref 0.76–1.27)
Glucose: 105 mg/dL — ABNORMAL HIGH (ref 70–99)
Potassium: 3.2 mmol/L — ABNORMAL LOW (ref 3.5–5.2)
Sodium: 143 mmol/L (ref 134–144)
eGFR: 120 mL/min/{1.73_m2} (ref >59)

## 2022-08-03 ENCOUNTER — Other Ambulatory Visit: Payer: Self-pay

## 2022-08-03 NOTE — Congregational Nurse Program (Signed)
  Dept: [phone]   Congregational Nurse Program Note  Date of Encounter: 08/03/2022 Client to clinic for blood pressure check. He did see the NP at [facility] last week and received refills on his amlodipine. Medication ready at [facility].  BP 130/68 (BP Location: Left Arm, Patient Position: Sitting, Cuff Size: Large)   Pulse 86   SpO2 95%  No new concerns today. Support given.  Past Medical History: Past Medical History:  Diagnosis Date   Alcohol abuse 03/03/2020   Hypertension     Encounter Details:  CNP Questionnaire - 08/03/22 1029       Questionnaire   Do you give verbal consent to treat you today? Yes    Location Patient Served  [facility]    Visit Setting Church or Organization    Patient Status Unknown   client sleeps at his sister's house most nights   Insurance Uninsured ([employer]/Self-Pay)    Insurance Referral N/A    Medication N/A    Medical Provider Yes    Screening Referrals N/A    Medical Referral N/A    Medical Appointment Made Non-[facility] PCP/clinic   client did attend the [facility] apt on 8/10. refill for amlodipine sent to [facility] by [facility] NP   Food N/A    Transportation N/A    Housing/Utilities No permanent housing   client reports he sleeps at night at his sister's house   Interpersonal Safety N/A    Intervention Support;Blood pressure;Educate;Case Management    ED Visit Averted N/A    Life-Saving Intervention Made N/A

## 2022-08-04 NOTE — Congregational Nurse Program (Signed)
  Dept: [phone]   Congregational Nurse Program Note  Date of Encounter: 08/04/2022 Client to clinic for vital sign monitoring.  BP 138/72 (BP Location: Left Arm, Patient Position: Sitting, Cuff Size: Large)   Pulse 82   SpO2 95%  He reports taking his amlodipine regularly for the last few days. No other needs today. Support given.  Past Medical History: Past Medical History:  Diagnosis Date   Alcohol abuse 03/03/2020   Hypertension     Encounter Details:  CNP Questionnaire - 08/04/22 1218       Questionnaire   Do you give verbal consent to treat you today? Yes    Location Patient Served  [facility]    Visit Setting Church or Organization    Patient Status Unknown   client sleeps at his sister's house most nights   Insurance Uninsured ([employer]/Self-Pay)    Insurance Referral N/A    Medication N/A   Medication provided by Medication [facility]   Medical Provider Yes   [facility]   Screening Referrals N/A    Medical Referral N/A    Medical Appointment Made N/A    Food N/A    Transportation N/A    Housing/Utilities No permanent housing   client reports he sleeps at night at his sister's house   Interpersonal Safety N/A    Intervention Support;Blood pressure;Educate    ED Visit Averted N/A    Life-Saving Intervention Made N/A

## 2022-08-05 NOTE — Congregational Nurse Program (Signed)
  Dept: [phone]   Congregational Nurse Program Note  Date of Encounter: 08/05/2022 Client to [facility] for continued blood pressure monitoring. BP 136/84 (BP Location: Left Arm, Patient Position: Sitting, Cuff Size: Large)   Pulse 74   SpO2 98% . He reportrs having taken his medication daily for the the last several days. RN provided education regarding the importance of taking the medication daily as ordered as it does appear to be keeping his blood pressure in better control.he voiced understanding.  Past Medical History: Past Medical History:  Diagnosis Date   Alcohol abuse 03/03/2020   Hypertension     Encounter Details:  CNP Questionnaire - 08/05/22 0930       Questionnaire   Do you give verbal consent to treat you today? Yes    Location Patient Served  [facility]    Visit Setting Church or Organization    Patient Status Homeless   client sleeps at his sister's house most nights   Insurance Uninsured ([employer]/Self-Pay)    Insurance Referral N/A    Medication N/A   Medication provided by Medication [facility]   Medical Provider Yes   [facility]   Screening Referrals N/A    Medical Referral N/A    Medical Appointment Made N/A    Food N/A    Transportation N/A    Housing/Utilities No permanent housing   client reports he sleeps at night at his sister's house   Interpersonal Safety N/A    Intervention Support;Blood pressure;Educate    ED Visit Averted N/A    Life-Saving Intervention Made N/A

## 2022-08-11 NOTE — Congregational Nurse Program (Signed)
  Dept: [phone]   Congregational Nurse Program Note  Date of Encounter: 08/11/2022 Client to [facility] clinic for blood pressure check.  BP (!) 138/96 (BP Location: Left Arm, Patient Position: Sitting, Cuff Size: Large)   Pulse 80   SpO2 97% . He is currently taking amlodipine 5 mg daily. Education given regarding taking medication at the same time daily if possible. Client reports he has not taken his medication yet as of this visit, but does have it with him and will take it this morning. No new concerns at this time. Support provided.   Past Medical History: Past Medical History:  Diagnosis Date   Alcohol abuse 03/03/2020   Hypertension     Encounter Details:  CNP Questionnaire - 08/11/22 0907       Questionnaire   Do you give verbal consent to treat you today? Yes    Location Patient Served  [facility]    Visit Setting Church or Organization    Patient Status Homeless   client sleeps at his sister's house most nights   Insurance Uninsured ([employer]/Self-Pay)    Insurance Referral N/A    Medication N/A   Medication provided by Medication [facility]   Medical Provider Yes   [facility]   Screening Referrals N/A    Medical Referral N/A    Medical Appointment Made N/A    Food N/A    Transportation N/A    Housing/Utilities No permanent housing   client reports he sleeps at night at his sister's house   Interpersonal Safety N/A    Intervention Support;Blood pressure;Educate    ED Visit Averted N/A    Life-Saving Intervention Made N/A

## 2022-08-12 NOTE — Congregational Nurse Program (Signed)
  Dept: [phone]   Congregational Nurse Program Note  Date of Encounter: 08/12/2022 Client to clinic for blood pressure check. BP (!) 144/86 (BP Location: Left Arm, Patient Position: Sitting, Cuff Size: Large)   Pulse 76   SpO2 97%  No other needs today. Support given.  Past Medical History: Past Medical History:  Diagnosis Date   Alcohol abuse 03/03/2020   Hypertension     Encounter Details:  CNP Questionnaire - 08/12/22 1159       Questionnaire   Do you give verbal consent to treat you today? Yes    Location Patient Served  [facility]    Visit Setting Church or Organization    Patient Status Homeless    Insurance Uninsured ([employer]/Self-Pay)    Insurance Referral N/A    Medication N/A    Medical Provider Yes    Screening Referrals N/A    Medical Referral N/A    Medical Appointment Made N/A    Food N/A    Transportation N/A   client uses [employer] bus system   Housing/Utilities No permanent housing    Interpersonal Safety N/A    Intervention Support;Blood pressure;Educate    ED Visit Averted N/A    Life-Saving Intervention Made N/A

## 2022-08-18 ENCOUNTER — Other Ambulatory Visit: Payer: Self-pay

## 2022-08-19 ENCOUNTER — Other Ambulatory Visit: Payer: Self-pay

## 2022-08-26 NOTE — Congregational Nurse Program (Signed)
  Dept: [phone]   Congregational Nurse Program Note  Date of Encounter: 08/26/2022  Clinic visit for blood pressure check, BP 104/70, pulse 96 and regular, O2 Sat 95%.  States that he is taking blood pressure meds regularly.  No complaints of any pain or discomfort. Past Medical History: Past Medical History:  Diagnosis Date   Alcohol abuse 03/03/2020   Hypertension     Encounter Details:

## 2022-08-30 NOTE — Congregational Nurse Program (Signed)
  Dept: [phone]   Congregational Nurse Program Note  Date of Encounter: 08/30/2022 Client to clinic for blood pressure check. BP (!) 148/88 (BP Location: Left Arm, Patient Position: Sitting, Cuff Size: Large)   Pulse 85   SpO2 99%  He reports he has not taken his medication in "a few days". Educated on the importance of consistently taking his amlodipine. He voiced understanding. Support provided.  Past Medical History: Past Medical History:  Diagnosis Date   Alcohol abuse 03/03/2020   Hypertension     Encounter Details:  CNP Questionnaire - 08/30/22 0957       Questionnaire   Do you give verbal consent to treat you today? Yes    Location Patient Served  [facility]    Visit Setting [facility] or Organization    Patient Status Homeless    Insurance Uninsured ([employer] Card/Care Connects/Self-Pay)    Insurance Referral N/A    Medication N/A   medication obtained at Medication management   Medical Provider Yes   [facility]   Screening Referrals N/A    Medical Referral N/A    Medical Appointment Made N/A    Food Have Food Insecurities    Transportation N/A   client uses the [employer] bus system   Housing/Utilities No permanent housing    Interpersonal Safety N/A    Intervention Blood pressure;Counsel;Support    ED Visit Averted N/A    Life-Saving Intervention Made N/A

## 2022-09-03 ENCOUNTER — Other Ambulatory Visit: Payer: Self-pay

## 2022-09-07 NOTE — Congregational Nurse Program (Signed)
  Dept: [place] Nurse Program Note  Date of Encounter: 09/07/2022 Client to clinic for continued vital sign check. BP (!) 152/90 (BP Location: Left Arm, Patient Position: Sitting, Cuff Size: Large)   Pulse 71   SpO2 100% Client reports he has been taking his Amlodipine as prescribed, but has not taken it today. RN encouraged client to take his medication at the same time daily if possible. No other concerns at this time.   Past Medical History: Past Medical History:  Diagnosis Date   Alcohol abuse 03/03/2020   Hypertension     Encounter Details:  CNP Questionnaire - 09/07/22 0900       Questionnaire   Do you give verbal consent to treat you today? Yes    Location Patient [place] or Organization    Patient Status Homeless    Insurance Uninsured ([employer] Card/Care Connects/Self-Pay)    Insurance Referral N/A    Medication N/A   medication obtained at Medication management   Medical Provider Yes   [facility]   Screening Referrals N/A    Medical Referral N/A    Medical Appointment Made N/A    Food Have Food Insecurities    Transportation N/A   client uses the [employer] bus system   Housing/Utilities No permanent housing    Interpersonal Safety N/A    Intervention Blood pressure;Counsel;Support    ED Visit Averted N/A    Life-Saving Intervention Made N/A

## 2022-09-11 ENCOUNTER — Emergency Department: Payer: Self-pay

## 2022-09-11 ENCOUNTER — Emergency Department
Admission: EM | Admit: 2022-09-11 | Discharge: 2022-09-11 | Disposition: A | Discharge status: Home / Self Care | Payer: Self-pay | Attending: Emergency Medicine | Admitting: Emergency Medicine

## 2022-09-11 DIAGNOSIS — F10129 Alcohol abuse with intoxication, unspecified: Secondary | ICD-10-CM | POA: Insufficient documentation

## 2022-09-11 LAB — CBC
HCT: 46 % (ref 39.0–52.0)
Hemoglobin: 14.7 g/dL (ref 13.0–17.0)
MCH: 27.7 pg (ref 26.0–34.0)
MCHC: 32 g/dL (ref 30.0–36.0)
MCV: 86.6 fL (ref 80.0–100.0)
Platelets: 392 10*3/uL (ref 150–400)
RBC: 5.31 MIL/uL (ref 4.22–5.81)
RDW: 13.9 % (ref 11.5–15.5)
WBC: 7.8 10*3/uL (ref 4.0–10.5)
nRBC: 0 % (ref 0.0–0.2)

## 2022-09-11 LAB — COMPREHENSIVE METABOLIC PANEL
ALT: 21 U/L (ref 0–44)
AST: 23 U/L (ref 15–41)
Albumin: 4.1 g/dL (ref 3.5–5.0)
Alkaline Phosphatase: 55 U/L (ref 38–126)
Anion gap: 12 (ref 5–15)
BUN: 11 mg/dL (ref 6–20)
CO2: 25 mmol/L (ref 22–32)
Calcium: 8.5 mg/dL — ABNORMAL LOW (ref 8.9–10.3)
Chloride: 108 mmol/L (ref 98–111)
Creatinine, Ser: 0.76 mg/dL (ref 0.61–1.24)
GFR, Estimated: >60 mL/min (ref >60)
Glucose, Bld: 105 mg/dL — ABNORMAL HIGH (ref 70–99)
Potassium: 2.7 mmol/L — CL (ref 3.5–5.1)
Sodium: 145 mmol/L (ref 135–145)
Total Bilirubin: 0.5 mg/dL (ref 0.3–1.2)
Total Protein: 7.7 g/dL (ref 6.5–8.1)

## 2022-09-11 LAB — ETHANOL: Alcohol, Ethyl (B): 351 mg/dL (ref <10)

## 2022-09-11 MED ORDER — POTASSIUM CHLORIDE 10 MEQ/100ML IV SOLN
10.0000 meq | Freq: Once | INTRAVENOUS | Status: AC
  Administered 2022-09-11: 10 meq via INTRAVENOUS
  Filled 2022-09-11: qty 100

## 2022-09-11 MED ORDER — NALOXONE HCL 2 MG/2ML IJ SOSY
2.0000 mg | PREFILLED_SYRINGE | Freq: Once | INTRAMUSCULAR | Status: AC
  Administered 2022-09-11: 2 mg via INTRAVENOUS
  Filled 2022-09-11: qty 2

## 2022-09-11 MED ORDER — SODIUM CHLORIDE 0.9 % IV BOLUS
1000.0000 mL | Freq: Once | INTRAVENOUS | Status: AC
  Administered 2022-09-11: 1000 mL via INTRAVENOUS

## 2022-09-11 NOTE — ED Notes (Signed)
Pt currently minimally responsive to sternal rub.  IV Narcan does not appear to have had an effect.  Pt vital signs stable at this time.

## 2022-09-11 NOTE — ED Notes (Signed)
Pt woke, this RN assisted pt to rest room, then assisted pt back to bed.  Pt was able, after multiple attempts, to verbalize his name and DOB.  This information was given to registration.  Pt currently resting in bed, vital signs stable.

## 2022-09-11 NOTE — ED Notes (Addendum)
This tech noticed pt O2 saturation 83% on RA. Pt placed on 2L O2 via [place] at this time. O2 saturation now 98% on 2L O2. Darnelle Maffucci, RN made aware.

## 2022-09-11 NOTE — ED Notes (Signed)
AVS provided to and discussed with patient. Pt verbalizes understanding of discharge instructions and denies any questions or concerns at this time. Pt ambulated out of department independently with steady gait.  

## 2022-09-11 NOTE — ED Provider Notes (Signed)
   [facility] Provider Note    Event Date/Time   First MD Initiated Contact with Patient 09/11/22 0045     (approximate)  History   Chief Complaint: unresponsive  HPI  Wesley Doyle is a 31 y.o. male with no known medical history presents to the emergency department unresponsive.  According to EMS they were called to a hotel where the patient was found in the hallway unresponsive.  They state minimal response to sternal rub.  They gave the patient intranasal Narcan with no result.  Patient does have pinpoint pupils but per EMS vital signs are stable including a pulse ox in the upper 90s on room air.  Here the patient remains unresponsive.  Continues to have pinpoint pupils and the emergency department is breathing on his own and satting well.  Minimal response to sternal rub.  Physical Exam   Most recent vital signs: There were no vitals filed for this visit.  General: Patient somnolent, minimal reaction to sternal rub.  Pinpoint pupils bilaterally. CV:  Good peripheral perfusion.  Regular rate and rhythm around 80 bpm. Resp:  Normal effort.  Equal breath sounds bilaterally.  No obvious rhonchi. Abd:  No distention.  Soft.  No reaction to palpation. Other:  No obvious head or extremity trauma noted.   ED Results / Procedures / Treatments   RADIOLOGY  I have reviewed and interpreted the head CT images.  I do not see any obvious intracranial hemorrhage on my evaluation.   MEDICATIONS ORDERED IN ED: Medications  naloxone (NARCAN) injection 2 mg (has no administration in time range)     IMPRESSION / MDM / ASSESSMENT AND PLAN / ED COURSE  I reviewed the triage vital signs and the nursing notes.  Patient's presentation is most consistent with acute presentation with potential threat to life or bodily function.  Patient presents emergency department and found in the hallway of a hotel unresponsive.  Minimal response to deep sternal rub.  Patient has  pinpoint pupils bilaterally reassuring vitals per EMS.  We will attempt to obtain IV access and dose 2 mg of IV Narcan.  If the patient has no response to Narcan we will consider further work-up labs and head imaging.  No signs of trauma on my exam.   No response to IV Narcan.  We will check labs and obtain CT imaging of the head.  Patient's chemistry shows hypokalemia at 2.7 we will replete with IV potassium.  CBC is normal.  Patient's ethanol level has resulted at 351 likely the cause of the patient's altered state.  Remainder of the work-up including CT scan does not appear to show any significant finding.  We will continue to closely monitor on telemetry in the emergency department while the patient metabolizes the alcohol.  Patient receiving IV fluids and IV potassium.  Patient was able to get up and ambulate to the restroom without issue.  Is gone back in bed and has gone back to sleep.  We will continue to allow the patient to rest and metabolize into the morning.  Patient now asking to be discharged home.  Is awake alert we will discharge patient from the emergency department.  He has been in the emergency department 6-1/2 hours.  FINAL CLINICAL IMPRESSION(S) / ED DIAGNOSES   Alcohol intoxication Unresponsive   Note:  This document was prepared using Dragon voice recognition software and may include unintentional dictation errors.   Harvest Dark, MD 09/12/22 [phone]

## 2022-09-11 NOTE — ED Notes (Signed)
Pt arouses to painful stimuli at this time.

## 2022-09-11 NOTE — ED Triage Notes (Signed)
Pt arrives from [facility] unresponsive. Per ems gave 2mg  intranasal narcan without response, pt with pinpoint pupils. Maintaining airway at this time. Pt does not arouse to sternal rub.

## 2022-09-15 NOTE — Congregational Nurse Program (Signed)
  Dept: [phone]   Congregational Nurse Program Note  Date of Encounter: 09/15/2022 Client to [facility] clinic today in an altered state. He reported"only smoking" later he was found to be unresponsive on the floor in the day room. Breathing was regular, oxygen saturation 89%, pulse 90. After several unsuccessful attempts to wake patient, RN contacted EMS. Both [facility] and EMS arrived. BP 100/58, blood sugar was reported as 128. Client was assessed and eventually did awaken after being  placed on the EMS stretcher.  He declined transportation to the hospital. He was given water and crackers and was able to walk out of the center with another participant.  Past Medical History: Past Medical History:  Diagnosis Date   Alcohol abuse 03/03/2020   Hypertension     Encounter Details:  CNP Questionnaire - 09/15/22 1233       Questionnaire   Do you give verbal consent to treat you today? Yes    Location Patient [place] or Organization    Patient Status Homeless    Insurance Uninsured ([employer] Card/Care Connects/Self-Pay)    Insurance Referral N/A    Medication N/A   medication obtained at Medication management   Medical Provider Yes   [facility]   Screening Referrals N/A    Medical Referral N/A    Medical Appointment Made N/A    Food Have Food Insecurities    Transportation N/A   client uses the [employer] bus system   Housing/Utilities No permanent housing    Interpersonal Safety N/A    Intervention Blood pressure;Counsel;Support    ED Visit Averted Yes    Life-Saving Intervention Made Yes

## 2022-09-23 NOTE — Congregational Nurse Program (Signed)
  Dept: [phone]   Congregational Nurse Program Note  Date of Encounter: 09/23/2022 Client to [facility] clinic today intoxicated and on edge. He reports he is not taking his blood pressure medication regularly. (!) 142/82 (BP Location: Left Arm, Patient Position: Sitting, Cuff Size: Large)   Pulse 84   SpO2 96%  Emotional support  and education provided.   Past Medical History: Past Medical History:  Diagnosis Date   Alcohol abuse 03/03/2020   Hypertension     Encounter Details:  CNP Questionnaire - 09/23/22 0905       Questionnaire   Ask client: Do you give verbal consent for me to treat you today? Yes    Student Assistance N/A    Location Patient [place]    Visit Setting with Client Organization    Patient Status Unhoused    Insurance Uninsured ([employer] Card/Care Connects/Self-Pay/Medicaid Family Planning)    Insurance/Financial Assistance Referral N/A    Medication N/A   medications through [place] Provider Yes   [facility]   Screening Referrals Made N/A    Medical Referrals Made N/A    Medical Appointment Made N/A    Recently w/o PCP, now 1st time PCP visit completed due to CNs referral or appointment made N/A    Food Have Food Insecurities    Transportation N/A   client uses the [employer] bus system   Housing/Utilities No permanent housing    Interpersonal Safety N/A    Interventions Advocate/Support    Abnormal to Normal Screening Since Last CN Visit N/A    Screenings CN Performed Blood Pressure    Sent Client to Lab for: N/A    Did client attend any of the following based off CNs referral or appointments made? N/A    ED Visit Averted N/A    Life-Saving Intervention Made N/A      Questionnaire   Do you give verbal consent to treat you today? Yes    Location Patient [place] or Organization    Patient Status Homeless    Insurance Uninsured ([employer] Card/Care  Connects/Self-Pay)    Insurance Referral N/A    Medication N/A   medication obtained at Medication management   Screening Referrals N/A    Intervention Blood pressure;Counsel;Support

## 2022-10-20 NOTE — Congregational Nurse Program (Signed)
  Dept: [phone]   Congregational Nurse Program Note  Date of Encounter: 10/20/2022 Client to [facility] for vital sign check. BP (!) 158/96 (BP Location: Left Arm, Patient Position: Sitting, Cuff Size: Large)   Pulse 75   SpO2 99%  Client reports he was given his amlodipine 5 mg daily while in jail. He has not taken it yet today. Client thinks he has a follow up apt next week at [facility]. RN called and left a msg to confirm time and date for him. Awaiting call back., client aware. Discussed smoking cessation, client was unable to smoke while in jail, but easily starts back when released. He is staying at night at his cousins.  Past Medical History: Past Medical History:  Diagnosis Date   Alcohol abuse 03/03/2020   Hypertension     Encounter Details:  CNP Questionnaire - 10/20/22 1005       Questionnaire   Ask client: Do you give verbal consent for me to treat you today? Yes    Student Assistance N/A    Location Patient [place]    Visit Setting with Client Organization    Patient Status Unhoused    Insurance Uninsured ([employer] Card/Care Connects/Self-Pay/Medicaid Family Planning)    Insurance/Financial Assistance Referral N/A    Medication N/A   medications through [place] Provider Yes   [facility]   Screening Referrals Made N/A    Medical Referrals Made N/A    Medical Appointment Made N/A   follow up call to [facility], possible apt next week   Recently w/o PCP, now 1st time PCP visit completed due to CNs referral or appointment made N/A    Food Have Food Insecurities    Transportation N/A   client uses the [employer] bus system   Housing/Utilities No permanent housing    Interpersonal Safety N/A    Interventions Advocate/Support;Reviewed Medications    Abnormal to Normal Screening Since Last CN Visit Blood Pressure    Screenings CN Performed Blood Pressure;Pulse Ox    Sent Client to Lab for: N/A    Did client attend  any of the following based off CNs referral or appointments made? N/A    ED Visit Averted N/A    Life-Saving Intervention Made N/A

## 2022-10-27 ENCOUNTER — Emergency Department
Admission: EM | Admit: 2022-10-27 | Discharge: 2022-10-27 | Disposition: A | Discharge status: Home / Self Care | Payer: Self-pay | Attending: Emergency Medicine | Admitting: Emergency Medicine

## 2022-10-27 ENCOUNTER — Other Ambulatory Visit: Payer: Self-pay

## 2022-10-27 ENCOUNTER — Encounter: Payer: Self-pay | Admitting: Emergency Medicine

## 2022-10-27 DIAGNOSIS — F17200 Nicotine dependence, unspecified, uncomplicated: Secondary | ICD-10-CM | POA: Insufficient documentation

## 2022-10-27 DIAGNOSIS — I10 Essential (primary) hypertension: Secondary | ICD-10-CM | POA: Insufficient documentation

## 2022-10-27 DIAGNOSIS — F10129 Alcohol abuse with intoxication, unspecified: Secondary | ICD-10-CM | POA: Insufficient documentation

## 2022-10-27 DIAGNOSIS — F10920 Alcohol use, unspecified with intoxication, uncomplicated: Secondary | ICD-10-CM

## 2022-10-27 DIAGNOSIS — Z5900 Homelessness unspecified: Secondary | ICD-10-CM | POA: Insufficient documentation

## 2022-10-27 LAB — COMPREHENSIVE METABOLIC PANEL
ALT: 27 U/L (ref 0–44)
AST: 40 U/L (ref 15–41)
Albumin: 4.6 g/dL (ref 3.5–5.0)
Alkaline Phosphatase: 69 U/L (ref 38–126)
Anion gap: 14 (ref 5–15)
BUN: 9 mg/dL (ref 6–20)
CO2: 27 mmol/L (ref 22–32)
Calcium: 8.8 mg/dL — ABNORMAL LOW (ref 8.9–10.3)
Chloride: 104 mmol/L (ref 98–111)
Creatinine, Ser: 0.89 mg/dL (ref 0.61–1.24)
GFR, Estimated: >60 mL/min (ref >60)
Glucose, Bld: 88 mg/dL (ref 70–99)
Potassium: 3.2 mmol/L — ABNORMAL LOW (ref 3.5–5.1)
Sodium: 145 mmol/L (ref 135–145)
Total Bilirubin: 0.7 mg/dL (ref 0.3–1.2)
Total Protein: 8.5 g/dL — ABNORMAL HIGH (ref 6.5–8.1)

## 2022-10-27 LAB — CBC
HCT: 45.3 % (ref 39.0–52.0)
Hemoglobin: 15 g/dL (ref 13.0–17.0)
MCH: 26.9 pg (ref 26.0–34.0)
MCHC: 33.1 g/dL (ref 30.0–36.0)
MCV: 81.2 fL (ref 80.0–100.0)
Platelets: 384 10*3/uL (ref 150–400)
RBC: 5.58 MIL/uL (ref 4.22–5.81)
RDW: 13.6 % (ref 11.5–15.5)
WBC: 8.9 10*3/uL (ref 4.0–10.5)
nRBC: 0 % (ref 0.0–0.2)

## 2022-10-27 NOTE — ED Triage Notes (Addendum)
Pt reports high BP at home with slight headache. States he is compliant with meds but BP has been running 160 systolic. Pt reports remote hx of seizure last year.  Pt is visibly intoxicated at time of triage and does endorse daily drinking

## 2022-10-27 NOTE — ED Notes (Signed)
Pt asking for water, pt alert and oriented at this time.

## 2022-10-27 NOTE — ED Notes (Signed)
Pt provided a dinner tray and juice.

## 2022-10-27 NOTE — ED Provider Notes (Signed)
[facility] Provider Note    Event Date/Time   First MD Initiated Contact with Patient 10/27/22 1843     (approximate)   History   Hypertension   HPI  GALE HULSE is a 31 y.o. male past medical history of alcohol use disorder who presents requesting detox.  Patient is quite intoxicated at the time my assessment falling asleep during exam so history is somewhat limited but tells me he needs help.  Last drink was today.  Tells me he was recently in prison.  Taking any additional history.  Patient's initial triage complaint was for hypertension.  He does not mention anything about his blood pressure to me.     Past Medical History:  Diagnosis Date   Alcohol abuse 03/03/2020   Hypertension     Patient Active Problem List   Diagnosis Date Noted   Smoking 03/09/2022   Alcohol abuse 07/24/2020   Alcohol intoxication (HCC) 07/24/2020   Encounter to establish care 04/30/2020   Essential hypertension 04/30/2020   Pain, dental 04/30/2020     Physical Exam  Triage Vital Signs: ED Triage Vitals  Enc Vitals Group     BP 10/27/22 1717 (!) 131/94     Pulse Rate 10/27/22 1717 88     Resp 10/27/22 1717 18     Temp 10/27/22 1717 98.4 F (36.9 C)     Temp Source 10/27/22 1717 Oral     SpO2 10/27/22 1717 97 %     Weight --      Height --      Head Circumference --      Peak Flow --      Pain Score 10/27/22 1719 4     Pain Loc --      Pain Edu? --      Excl. in GC? --     Most recent vital signs: Vitals:   10/27/22 2116 10/27/22 2216  BP:  122/80  Pulse:  82  Resp:  18  Temp: 98.2 F (36.8 C)   SpO2:  98%     General: Awake, no distress. CV:  Good peripheral perfusion.  Resp:  Normal effort.  Abd:  No distention.  Neuro:             Awake, Alert, Oriented x 3  Other:  Patient is fairly somnolent, frequently falls asleep during exam, smells of alcohol, when he does awaken he has some slurred speech but moves all of his extremities  symmetrically   ED Results / Procedures / Treatments  Labs (all labs ordered are listed, but only abnormal results are displayed) Labs Reviewed  COMPREHENSIVE METABOLIC PANEL - Abnormal; Notable for the following components:      Result Value   Potassium 3.2 (*)    Calcium 8.8 (*)    Total Protein 8.5 (*)    All other components within normal limits  CBC     EKG     RADIOLOGY    PROCEDURES:  Critical Care performed: No  Procedures   MEDICATIONS ORDERED IN ED: Medications - No data to display   IMPRESSION / MDM / ASSESSMENT AND PLAN / ED COURSE  I reviewed the triage vital signs and the nursing notes.                              Patient's presentation is most consistent with acute, uncomplicated illness.  Differential diagnosis includes, but is not  limited to, alcohol intoxication, other substance use disorder  Patient is a 31 year old male with known alcohol use disorder presents with intoxication.  Triage note says that patient reported high blood pressure at home with slight headache.  Patient is quite intoxicated and rather somnolent when I am evaluating him which limits the history taking.  He says that he would like detox.  Patient also tells me he is homeless.  Currently he is too intoxicated to safely leave the ED without safe plan or a ride.  Will observe until he is more clinically sober.  Labs were drawn from triage including a CBC and CMP which are notable for mild hypokalemia but otherwise within normal limits.    Patient was observed till clinically sober.  Tolerating p.o. and walking with steady gait.  He is appropriate for discharge.   FINAL CLINICAL IMPRESSION(S) / ED DIAGNOSES   Final diagnoses:  Alcoholic intoxication without complication (HCC)     Rx / DC Orders   ED Discharge Orders     None        Note:  This document was prepared using Dragon voice recognition software and may include unintentional dictation errors.    Georga Hacking, MD 10/27/22 2322

## 2022-10-28 ENCOUNTER — Ambulatory Visit: Payer: Self-pay | Admitting: Gerontology

## 2022-10-28 NOTE — Congregational Nurse Program (Signed)
  Dept: [phone]   Congregational Nurse Program Note  Date of Encounter: 10/28/2022 Client to [facility] day center for vital sign check. He has been drinking more heavily the last week and was asked to leave the center yesterday . He was sober this morning and reports taking his BP medication intermittently. Education again provided about the importance of taking it regularly for consistent BP control. Client voiced understanding. /Emotional support given. Client encouraged to return to clinic next week for the men's recovery group on 11/14. Past Medical History: Past Medical History:  Diagnosis Date   Alcohol abuse 03/03/2020   Hypertension     Encounter Details:  CNP Questionnaire - 10/28/22 0954       Questionnaire   Ask client: Do you give verbal consent for me to treat you today? Yes    Student Assistance N/A    Location Patient Served  [facility]    Visit Setting with Client Organization    Patient Status Unhoused    Insurance Uninsured ([employer] Card/Care Connects/Self-Pay/Medicaid Family Planning)    Insurance/Financial Assistance Referral N/A    Medication N/A   medications through [facility] community  pharmacy   Medical Provider Yes   [facility]   Screening Referrals Made N/A    Medical Referrals Made N/A    Medical Appointment Made N/A   follow up call to [facility], possible apt next week   Recently w/o PCP, now 1st time PCP visit completed due to CNs referral or appointment made N/A    Food Have Food Insecurities    Transportation N/A   client uses the [employer] bus system   Housing/Utilities No permanent housing    Interpersonal Safety N/A    Interventions Advocate/Support;Educate    Abnormal to Normal Screening Since Last CN Visit N/A    Screenings CN Performed Blood Pressure;Pulse Ox    Sent Client to Lab for: N/A    Did client attend any of the following based off CNs referral or appointments made? N/A    ED Visit Averted N/A    Life-Saving Intervention  Made N/A

## 2022-11-04 ENCOUNTER — Ambulatory Visit: Payer: Self-pay | Admitting: Gerontology

## 2022-11-09 NOTE — Congregational Nurse Program (Signed)
  Dept: [phone]   Congregational Nurse Program Note  Date of Encounter: 11/09/2022 Client to [facility] day center and agreeable to vital sign check. BP (!) 148/88 (BP Location: Left Arm, Patient Position: Sitting, Cuff Size: Large)   Pulse (!) 103   SpO2 99% . He reports he has not been taking his blood pressure medication regularly., he has a new baby, who was born premature and reports eh was up all last night at [facility], [facility], [facility] with her. RN provided education regarding the importance of taking his medication regularly. Client voices understanding, but has difficulty following through. Support given. No other needs at this time.  Past Medical History: Past Medical History:  Diagnosis Date   Alcohol abuse 03/03/2020   Hypertension     Encounter Details:  CNP Questionnaire - 11/09/22 0902       Questionnaire   Ask client: Do you give verbal consent for me to treat you today? Yes    Student Assistance N/A    Location Patient Served  [facility]    Visit Setting with Client Organization    Patient Status Unhoused    Insurance Uninsured ([employer] Card/Care Connects/Self-Pay/Medicaid Family Planning)    Insurance/Financial Assistance Referral N/A    Medication N/A   medications through [facility] community  pharmacy   Medical Provider Yes   [facility]   Screening Referrals Made N/A    Medical Referrals Made N/A    Medical Appointment Made N/A   follow up call to [facility], possible apt next week   Recently w/o PCP, now 1st time PCP visit completed due to CNs referral or appointment made N/A    Food Have Food Insecurities    Transportation N/A   client uses the [employer] bus system   Housing/Utilities No permanent housing    Interpersonal Safety N/A    Interventions Advocate/Support;Educate    Abnormal to Normal Screening Since Last CN Visit N/A    Screenings CN Performed Blood Pressure;Pulse Ox    Sent Client to Lab for: N/A    Did client attend any of the following based  off CNs referral or appointments made? N/A    ED Visit Averted N/A    Life-Saving Intervention Made N/A

## 2022-11-25 NOTE — Congregational Nurse Program (Signed)
  Dept: [phone]   Congregational Nurse Program Note  Date of Encounter: 11/25/2022 Client to [facility] day center with report of having a seizure "yesterday"  at [employer] on [address] He was with friends, who contacted EMS and he was taken to [facility]. He related the events of going to [facility] and reports he did not stay for an MRI, that he was given "new" medication, but did not have it yet, he was to pick it up at the [facility] pharmacy? [facility] notes reviewed, client was in the [facility] ED on 12/5, he was prescribed librium, but no antiseizure medications as per chart note review, the seizure was believed to be from a decrease in his alcohol consumption in the previous days. He denied drinking daily.   He plans to go to [facility] today as he has a newborn daughter in the neonatal ICU and has been getting rides there almost daily. RN encouraged client to get the medication today and to take it as directed. Client voiced understanding. Emotional support given. Past Medical History: Past Medical History:  Diagnosis Date   Alcohol abuse 03/03/2020   Hypertension     Encounter Details:  CNP Questionnaire - 11/25/22 0846       Questionnaire   Ask client: Do you give verbal consent for me to treat you today? Yes    Student Assistance N/A    Location Patient Served  [facility]    Visit Setting with Client Organization    Patient Status Unhoused    Insurance Uninsured ([employer] Connects/Self-Pay/Medicaid Family Planning)    Insurance/Financial Assistance Referral N/A    Medication N/A   medications through [facility] community  pharmacy   Medical Provider Yes   [facility]   Screening Referrals Made N/A    Medical Referrals Made N/A    Medical Appointment Made N/A   follow up call to [facility], possible apt next week   Recently w/o PCP, now 1st time PCP visit completed due to CNs referral or appointment made N/A    Food Have Food Insecurities    Transportation N/A   client uses the [employer] bus  system   Housing/Utilities No permanent housing    Interpersonal Safety N/A    Interventions Advocate/Support;Educate;Reviewed D/C Planning    Abnormal to Normal Screening Since Last CN Visit N/A    Screenings CN Performed N/A    Sent Client to Lab for: N/A    Did client attend any of the following based off CNs referral or appointments made? N/A    ED Visit Averted N/A    Life-Saving Intervention Made N/A

## 2022-12-08 NOTE — Congregational Nurse Program (Signed)
  Dept: [phone]   Congregational Nurse Program Note  Date of Encounter: 12/08/2022 Client to [facility] day center with complaints of a sore throat., he reports having difficulty swallowing food d/t soreness. Swollen lymph nodes noted to jaw line. RN unable to visualize past his oral cavity. RN recommended frequent liquids, cold or warm which ever gave more relief.  No cough, or congestion noted. Client to receive acetaminophen from center  case manager. RN placed a call to the [facility] clinic to schedule an appointment for client to be seen, message left. Client aware. Client encouraged to follow up with [facility] if no call back is received during center hours today. Past Medical History: Past Medical History:  Diagnosis Date   Alcohol abuse 03/03/2020   Hypertension     Encounter Details:  CNP Questionnaire - 12/08/22 0905       Questionnaire   Ask client: Do you give verbal consent for me to treat you today? Yes    Student Assistance N/A    Location [facility] Served  [facility]    Visit Setting with Client Organization    Patient Status Unhoused    Insurance Uninsured ([employer]s/Self-Pay/Medicaid Family Planning)    Insurance/Financial Assistance Referral N/A    Medication N/A   medications through [facility] community  pharmacy. Client is not taking regularly   Medical Provider Yes   [facility]   Screening Referrals Made N/A    Medical Referrals Made N/A    Medical Appointment Made N/A   call placed to [facility], left msg, no call back   Recently w/o PCP, now 1st time PCP visit completed due to CNs referral or appointment made N/A    Food Have Food Insecurities    Transportation N/A   client uses the [employer] bus system   Housing/Utilities No permanent housing    Interpersonal Safety N/A    Interventions Advocate/Support;Educate    Abnormal to Normal Screening Since Last CN Visit N/A    Screenings CN Performed N/A    Sent Client to Lab for: N/A     Did client attend any of the following based off CNs referral or appointments made? N/A    ED Visit Averted N/A    Life-Saving Intervention Made N/A

## 2023-11-25 ENCOUNTER — Other Ambulatory Visit: Payer: Self-pay

## 2023-11-25 ENCOUNTER — Emergency Department: Payer: Self-pay

## 2023-11-25 ENCOUNTER — Observation Stay
Admission: EM | Admit: 2023-11-25 | Discharge: 2023-11-26 | Disposition: A | Discharge status: Home / Self Care | Payer: 59 | Attending: Internal Medicine | Admitting: Internal Medicine

## 2023-11-25 DIAGNOSIS — J36 Peritonsillar abscess: Secondary | ICD-10-CM | POA: Diagnosis not present

## 2023-11-25 DIAGNOSIS — Z87898 Personal history of other specified conditions: Secondary | ICD-10-CM

## 2023-11-25 DIAGNOSIS — J029 Acute pharyngitis, unspecified: Secondary | ICD-10-CM | POA: Diagnosis not present

## 2023-11-25 DIAGNOSIS — I10 Essential (primary) hypertension: Secondary | ICD-10-CM | POA: Insufficient documentation

## 2023-11-25 DIAGNOSIS — E876 Hypokalemia: Secondary | ICD-10-CM | POA: Insufficient documentation

## 2023-11-25 DIAGNOSIS — F17210 Nicotine dependence, cigarettes, uncomplicated: Secondary | ICD-10-CM | POA: Diagnosis not present

## 2023-11-25 DIAGNOSIS — F17200 Nicotine dependence, unspecified, uncomplicated: Secondary | ICD-10-CM | POA: Diagnosis present

## 2023-11-25 DIAGNOSIS — F1010 Alcohol abuse, uncomplicated: Secondary | ICD-10-CM | POA: Diagnosis present

## 2023-11-25 DIAGNOSIS — R590 Localized enlarged lymph nodes: Secondary | ICD-10-CM | POA: Diagnosis not present

## 2023-11-25 DIAGNOSIS — Z79899 Other long term (current) drug therapy: Secondary | ICD-10-CM | POA: Insufficient documentation

## 2023-11-25 DIAGNOSIS — J392 Other diseases of pharynx: Secondary | ICD-10-CM | POA: Diagnosis not present

## 2023-11-25 DIAGNOSIS — J341 Cyst and mucocele of nose and nasal sinus: Secondary | ICD-10-CM | POA: Diagnosis not present

## 2023-11-25 DIAGNOSIS — J039 Acute tonsillitis, unspecified: Secondary | ICD-10-CM | POA: Diagnosis not present

## 2023-11-25 LAB — CBC WITH DIFFERENTIAL/PLATELET
Abs Immature Granulocytes: 0.05 10*3/uL (ref 0.00–0.07)
Basophils Absolute: 0 10*3/uL (ref 0.0–0.1)
Basophils Relative: 0 %
Eosinophils Absolute: 0.2 10*3/uL (ref 0.0–0.5)
Eosinophils Relative: 1 %
HCT: 44.3 % (ref 39.0–52.0)
Hemoglobin: 14.9 g/dL (ref 13.0–17.0)
Immature Granulocytes: 0 %
Lymphocytes Relative: 15 %
Lymphs Abs: 2.5 10*3/uL (ref 0.7–4.0)
MCH: 27.4 pg (ref 26.0–34.0)
MCHC: 33.6 g/dL (ref 30.0–36.0)
MCV: 81.6 fL (ref 80.0–100.0)
Monocytes Absolute: 1.7 10*3/uL — ABNORMAL HIGH (ref 0.1–1.0)
Monocytes Relative: 10 %
Neutro Abs: 12.3 10*3/uL — ABNORMAL HIGH (ref 1.7–7.7)
Neutrophils Relative %: 74 %
Platelets: 342 10*3/uL (ref 150–400)
RBC: 5.43 MIL/uL (ref 4.22–5.81)
RDW: 12.8 % (ref 11.5–15.5)
WBC: 16.7 10*3/uL — ABNORMAL HIGH (ref 4.0–10.5)
nRBC: 0 % (ref 0.0–0.2)

## 2023-11-25 LAB — GROUP A STREP BY PCR: Group A Strep by PCR: NOT DETECTED

## 2023-11-25 LAB — BASIC METABOLIC PANEL
Anion gap: 6 (ref 5–15)
BUN: 10 mg/dL (ref 6–20)
CO2: 28 mmol/L (ref 22–32)
Calcium: 8.5 mg/dL — ABNORMAL LOW (ref 8.9–10.3)
Chloride: 102 mmol/L (ref 98–111)
Creatinine, Ser: 0.61 mg/dL (ref 0.61–1.24)
GFR, Estimated: >60 mL/min (ref >60)
Glucose, Bld: 133 mg/dL — ABNORMAL HIGH (ref 70–99)
Potassium: 3.2 mmol/L — ABNORMAL LOW (ref 3.5–5.1)
Sodium: 136 mmol/L (ref 135–145)

## 2023-11-25 LAB — ETHANOL: Alcohol, Ethyl (B): <10 mg/dL (ref <10)

## 2023-11-25 LAB — HEPATIC FUNCTION PANEL
ALT: 33 U/L (ref 0–44)
AST: 23 U/L (ref 15–41)
Albumin: 3.6 g/dL (ref 3.5–5.0)
Alkaline Phosphatase: 64 U/L (ref 38–126)
Bilirubin, Direct: 0.1 mg/dL (ref 0.0–0.2)
Indirect Bilirubin: 0.3 mg/dL (ref 0.3–0.9)
Total Bilirubin: 0.4 mg/dL (ref <1.2)
Total Protein: 7.4 g/dL (ref 6.5–8.1)

## 2023-11-25 LAB — HEMOGLOBIN A1C
Hgb A1c MFr Bld: 5.8 % — ABNORMAL HIGH (ref 4.8–5.6)
Mean Plasma Glucose: 119.76 mg/dL

## 2023-11-25 LAB — LACTIC ACID, PLASMA: Lactic Acid, Venous: 1.7 mmol/L (ref 0.5–1.9)

## 2023-11-25 MED ORDER — KETOROLAC TROMETHAMINE 15 MG/ML IJ SOLN
15.0000 mg | Freq: Once | INTRAMUSCULAR | Status: AC
  Administered 2023-11-25: 15 mg via INTRAVENOUS
  Filled 2023-11-25: qty 1

## 2023-11-25 MED ORDER — PANTOPRAZOLE SODIUM 40 MG IV SOLR
40.0000 mg | Freq: Two times a day (BID) | INTRAVENOUS | Status: DC
  Administered 2023-11-25: 40 mg via INTRAVENOUS
  Filled 2023-11-25 (×2): qty 10

## 2023-11-25 MED ORDER — LIDOCAINE HCL (PF) 4 % IJ SOLN
4.0000 mL | Freq: Once | INTRAMUSCULAR | Status: DC
  Filled 2023-11-25: qty 5

## 2023-11-25 MED ORDER — SODIUM CHLORIDE 0.9 % IV SOLN
3.0000 g | Freq: Once | INTRAVENOUS | Status: AC
  Administered 2023-11-25: 3 g via INTRAVENOUS
  Filled 2023-11-25: qty 8

## 2023-11-25 MED ORDER — ACETAMINOPHEN 325 MG PO TABS: 650.0000 mg | ORAL_TABLET | Freq: Four times a day (QID) | ORAL | Status: DC | PRN

## 2023-11-25 MED ORDER — ACETAMINOPHEN 650 MG RE SUPP
650.0000 mg | Freq: Four times a day (QID) | RECTAL | Status: DC | PRN
Start: 2023-11-25 — End: 2023-11-26

## 2023-11-25 MED ORDER — MORPHINE SULFATE (PF) 4 MG/ML IV SOLN
4.0000 mg | INTRAVENOUS | Status: DC | PRN
  Administered 2023-11-25: 4 mg via INTRAVENOUS
  Filled 2023-11-25: qty 1

## 2023-11-25 MED ORDER — THIAMINE HCL 100 MG/ML IJ SOLN
100.0000 mg | INTRAMUSCULAR | Status: DC
  Administered 2023-11-25: 100 mg via INTRAVENOUS
  Filled 2023-11-25: qty 2

## 2023-11-25 MED ORDER — HEPARIN SODIUM (PORCINE) 5000 UNIT/ML IJ SOLN
5000.0000 [IU] | Freq: Once | INTRAMUSCULAR | Status: AC
  Administered 2023-11-25: 5000 [IU] via SUBCUTANEOUS
  Filled 2023-11-25: qty 1

## 2023-11-25 MED ORDER — SODIUM CHLORIDE 0.9% FLUSH: 3.0000 mL | Freq: Two times a day (BID) | INTRAVENOUS | Status: DC

## 2023-11-25 MED ORDER — SODIUM CHLORIDE 0.9 % IV BOLUS
1000.0000 mL | Freq: Once | INTRAVENOUS | Status: AC
  Administered 2023-11-25: 1000 mL via INTRAVENOUS

## 2023-11-25 MED ORDER — DEXAMETHASONE SODIUM PHOSPHATE 10 MG/ML IJ SOLN
10.0000 mg | Freq: Once | INTRAMUSCULAR | Status: AC
  Administered 2023-11-25: 10 mg via INTRAVENOUS
  Filled 2023-11-25: qty 1

## 2023-11-25 MED ORDER — SODIUM CHLORIDE 0.9 % IV SOLN: INTRAVENOUS | Status: DC

## 2023-11-25 MED ORDER — CLINDAMYCIN PHOSPHATE 900 MG/50ML IV SOLN: 900.0000 mg | Freq: Three times a day (TID) | INTRAVENOUS | Status: DC

## 2023-11-25 MED ORDER — MORPHINE SULFATE (PF) 2 MG/ML IV SOLN: 2.0000 mg | INTRAVENOUS | Status: DC | PRN

## 2023-11-25 MED ORDER — NICOTINE 21 MG/24HR TD PT24
21.0000 mg | MEDICATED_PATCH | Freq: Every day | TRANSDERMAL | Status: DC
  Administered 2023-11-25: 21 mg via TRANSDERMAL
  Filled 2023-11-25: qty 1

## 2023-11-25 MED ORDER — IOHEXOL 300 MG/ML  SOLN
75.0000 mL | Freq: Once | INTRAMUSCULAR | Status: AC | PRN
  Administered 2023-11-25: 75 mL via INTRAVENOUS

## 2023-11-25 NOTE — Assessment & Plan Note (Signed)
Suspect from history of alcohol no trauma reported.  As above

## 2023-11-25 NOTE — Assessment & Plan Note (Signed)
Thiamine 100 g, CIWA protocol, seizure precautions aspiration precautions fall precautions

## 2023-11-25 NOTE — H&P (Signed)
History and Physical    Patient: Wesley Doyle ZOX:[id number] DOB: [birth year] DOA: 11/25/2023 DOS: the patient was seen and examined on 11/25/2023 PCP: Pcp, No  Patient coming from: Home  Chief Complaint:  Chief Complaint  Patient presents with   Sore Throat    HPI: Wesley Doyle is a 32 y.o. 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  Symptoms have been noted for about 3 days 4 days.  Patient does not yes to report ports of difficulty with swallowing.  Last alcoholic drink was about 2 days ago.  No history of alcohol withdrawals.   In emergency room vitals trend shows: Vitals:   11/25/23 1616 11/25/23 2012 11/25/23 2013 11/25/23 2016  BP: (!) 177/111  (!) 144/108   Pulse: 76 71    Temp: 98.3 F (36.8 C)   98.6 F (37 C)  Resp: 18 18    SpO2: 99% 96%    Labs are notable for : -Metabolic panel shows potassium 3.2 glucose 133 calcium 8.5 normal LFTs.. -CBC shows leukocytosis of 16.7 normal hemoglobin and platelet count.  CT soft tissue shows a 1.9 cm right peritonsillar abscess reactive cervical lymphadenopathy.  In the ED pt received: Medications  nicotine (NICODERM CQ - dosed in mg/24 hours) patch 21 mg (has no administration in time range)  thiamine (VITAMIN B1) injection 100 mg (has no administration in time range)  pantoprazole (PROTONIX) injection 40 mg (has no administration in time range)  Ampicillin-Sulbactam (UNASYN) 3 g in sodium chloride 0.9 % 100 mL IVPB (has no administration in time range)  clindamycin (CLEOCIN) IVPB 900 mg (has no administration in time range)  morphine (PF) 2 MG/ML injection 2 mg (has no administration in time range)  heparin injection 5,000 Units (has no administration in time range)   sodium chloride flush (NS) 0.9 % injection 3 mL (has no administration in time range)  0.9 %  sodium chloride infusion (has no administration in time range)  acetaminophen (TYLENOL) tablet 650 mg (has no administration in time range)    Or  acetaminophen (TYLENOL) suppository 650 mg (has no administration in time range)  dexamethasone (DECADRON) injection 10 mg (10 mg Intravenous Given 11/25/23 1850)  ketorolac (TORADOL) 15 MG/ML injection 15 mg (15 mg Intravenous Given 11/25/23 1851)  iohexol (OMNIPAQUE) 300 MG/ML solution 75 mL (75 mLs Intravenous Contrast Given 11/25/23 1801)  sodium chloride 0.9 % bolus 1,000 mL (0 mLs Intravenous Stopped 11/25/23 2010)  Ampicillin-Sulbactam (UNASYN) 3 g in sodium chloride 0.9 % 100 mL IVPB (0 g Intravenous Stopped 11/25/23 2010)     Review of Systems  Unable to perform ROS: Acuity of condition   Past Medical History:  Diagnosis Date   Alcohol abuse 03/03/2020   Hypertension    Past Surgical History:  Procedure Laterality Date   NO PAST SURGERIES      reports that he has been smoking cigarettes. He has a 7.5 pack-year smoking history. He has never used smokeless tobacco. He reports current alcohol use. He reports current drug use. Frequency: 3.00 times per week. Drug: Marijuana.  No Known Allergies  Family History  Problem Relation Age of Onset   Other Mother        murdered   Diabetes Father    Hypertension Maternal Grandmother  Diabetes Maternal Grandmother    Other Maternal Grandfather        unknown medical history   Other Paternal Grandmother        unknown medical history   Other Paternal Grandfather        unknown medical history    Prior to Admission medications   Medication Sig Start Date End Date Taking? Authorizing Provider  amLODipine (NORVASC) 5 MG tablet Take 1 tablet (5 mg total) by mouth once daily. 07/29/22   Iloabachie, Chioma E, NP  Blood Pressure KIT Use as directed once daily. 03/09/22   Iloabachie, Chioma E, NP      Vitals:   11/25/23 1616 11/25/23 2012 11/25/23 2013 11/25/23 2016  BP: (!) 177/111  (!) 144/108   Pulse: 76 71    Resp: 18 18    Temp: 98.3 F (36.8 C)   98.6 F (37 C)  SpO2: 99% 96%     Physical Exam Vitals and nursing note reviewed.  Constitutional:      General: He is not in acute distress. HENT:     Head: Normocephalic and atraumatic.     Right Ear: Hearing normal.     Left Ear: Hearing normal. No drainage.     Nose: Nose normal. No nasal deformity.     Mouth/Throat:     Lips: Pink.     Pharynx: Oropharynx is clear.     Tonsils: Tonsillar abscess present. 2+ on the right.  Eyes:     General: Lids are normal.     Extraocular Movements: Extraocular movements intact.     Left eye: Normal extraocular motion.  Cardiovascular:     Rate and Rhythm: Regular rhythm. Tachycardia present.     Heart sounds: Normal heart sounds.  Pulmonary:     Effort: Pulmonary effort is normal.     Breath sounds: Normal breath sounds.  Abdominal:     General: Bowel sounds are normal. There is no distension.     Palpations: Abdomen is soft. There is no mass.     Tenderness: There is no abdominal tenderness.  Musculoskeletal:     Right lower leg: No edema.     Left lower leg: No edema.  Skin:    General: Skin is warm.  Neurological:     General: No focal deficit present.     Mental Status: He is alert and oriented to person, place, and time.     Cranial Nerves: Cranial nerves 2-12 are intact.  Psychiatric:        Attention and Perception: Attention normal.        Speech: Speech normal.        Behavior: Behavior is cooperative.     Labs on Admission: I have personally reviewed following labs and imaging studies Results for orders placed or performed during the hospital encounter of 11/25/23 (from the past 24 hour(s))  Group A Strep by PCR     Status: None   Collection Time: 11/25/23  4:19 PM   Specimen: Throat; Sterile Swab  Result Value Ref Range   Group A Strep by PCR NOT  DETECTED NOT DETECTED  CBC with Differential     Status: Abnormal   Collection Time: 11/25/23  5:21 PM  Result Value Ref Range   WBC 16.7 (H) 4.0 - 10.5 K/uL   RBC 5.43 4.22 - 5.81 MIL/uL   Hemoglobin 14.9 13.0 - 17.0 g/dL   HCT 29.5 62.1 - 30.8 %   MCV 81.6 80.0 - 100.0 fL  MCH 27.4 26.0 - 34.0 pg   MCHC 33.6 30.0 - 36.0 g/dL   RDW 29.5 62.1 - 30.8 %   Platelets 342 150 - 400 K/uL   nRBC 0.0 0.0 - 0.2 %   Neutrophils Relative % 74 %   Neutro Abs 12.3 (H) 1.7 - 7.7 K/uL   Lymphocytes Relative 15 %   Lymphs Abs 2.5 0.7 - 4.0 K/uL   Monocytes Relative 10 %   Monocytes Absolute 1.7 (H) 0.1 - 1.0 K/uL   Eosinophils Relative 1 %   Eosinophils Absolute 0.2 0.0 - 0.5 K/uL   Basophils Relative 0 %   Basophils Absolute 0.0 0.0 - 0.1 K/uL   Immature Granulocytes 0 %   Abs Immature Granulocytes 0.05 0.00 - 0.07 K/uL  Basic metabolic panel     Status: Abnormal   Collection Time: 11/25/23  5:21 PM  Result Value Ref Range   Sodium 136 135 - 145 mmol/L   Potassium 3.2 (L) 3.5 - 5.1 mmol/L   Chloride 102 98 - 111 mmol/L   CO2 28 22 - 32 mmol/L   Glucose, Bld 133 (H) 70 - 99 mg/dL   BUN 10 6 - 20 mg/dL   Creatinine, Ser 6.57 0.61 - 1.24 mg/dL   Calcium 8.5 (L) 8.9 - 10.3 mg/dL   GFR, Estimated >84 >69 mL/min   Anion gap 6 5 - 15    CBC: Recent Labs  Lab 11/25/23 1721  WBC 16.7*  NEUTROABS 12.3*  HGB 14.9  HCT 44.3  MCV 81.6  PLT 342   Basic Metabolic Panel: Recent Labs  Lab 11/25/23 1721  NA 136  K 3.2*  CL 102  CO2 28  GLUCOSE 133*  BUN 10  CREATININE 0.61  CALCIUM 8.5*   Urinalysis    Component Value Date/Time   COLORURINE Straw 08/02/2013 0742   APPEARANCEUR Turbid (A) 04/30/2020 1020   LABSPEC 1.005 08/02/2013 0742   PHURINE 5.0 08/02/2013 0742   GLUCOSEU Negative 04/30/2020 1020   GLUCOSEU Negative 08/02/2013 0742   HGBUR Negative 08/02/2013 0742   BILIRUBINUR Negative 04/30/2020 1020   BILIRUBINUR Negative 08/02/2013 0742   KETONESUR Negative  08/02/2013 0742   PROTEINUR Negative 04/30/2020 1020   PROTEINUR Negative 08/02/2013 0742   NITRITE Negative 04/30/2020 1020   NITRITE Negative 08/02/2013 0742   LEUKOCYTESUR Negative 04/30/2020 1020   LEUKOCYTESUR Negative 08/02/2013 0742   Unresulted Labs (From admission, onward)     Start     Ordered   11/26/23 0500  Comprehensive metabolic panel  Tomorrow morning,   STAT        11/25/23 2021   11/26/23 0500  CBC  Tomorrow morning,   STAT        11/25/23 2021   11/25/23 2020  HIV Antibody (routine testing w rflx)  (HIV Antibody (Routine testing w reflex) panel)  Once,   URGENT        11/25/23 2021   11/25/23 2017  Lactic acid, plasma  (Lactic Acid)  STAT Now then every 3 hours,   STAT      11/25/23 2016   11/25/23 2014  Hepatic function panel  Add-on,   AD        11/25/23 2016   11/25/23 2014  Ethanol  Add-on,   AD        11/25/23 2016   11/25/23 2014  High sensitivity CRP  Add-on,   AD        11/25/23 2016   11/25/23 2014  Hemoglobin A1c  Add-on,   AD        11/25/23 2016           Medications  nicotine (NICODERM CQ - dosed in mg/24 hours) patch 21 mg (has no administration in time range)  thiamine (VITAMIN B1) injection 100 mg (has no administration in time range)  pantoprazole (PROTONIX) injection 40 mg (has no administration in time range)  Ampicillin-Sulbactam (UNASYN) 3 g in sodium chloride 0.9 % 100 mL IVPB (has no administration in time range)  clindamycin (CLEOCIN) IVPB 900 mg (has no administration in time range)  morphine (PF) 2 MG/ML injection 2 mg (has no administration in time range)  heparin injection 5,000 Units (has no administration in time range)  sodium chloride flush (NS) 0.9 % injection 3 mL (has no administration in time range)  0.9 %  sodium chloride infusion (has no administration in time range)  acetaminophen (TYLENOL) tablet 650 mg (has no administration in time range)    Or  acetaminophen (TYLENOL) suppository 650 mg (has no administration  in time range)  dexamethasone (DECADRON) injection 10 mg (10 mg Intravenous Given 11/25/23 1850)  ketorolac (TORADOL) 15 MG/ML injection 15 mg (15 mg Intravenous Given 11/25/23 1851)  iohexol (OMNIPAQUE) 300 MG/ML solution 75 mL (75 mLs Intravenous Contrast Given 11/25/23 1801)  sodium chloride 0.9 % bolus 1,000 mL (0 mLs Intravenous Stopped 11/25/23 2010)  Ampicillin-Sulbactam (UNASYN) 3 g in sodium chloride 0.9 % 100 mL IVPB (0 g Intravenous Stopped 11/25/23 2010)    Radiological Exams on Admission: CT Soft Tissue Neck W Contrast  Result Date: 11/25/2023 CLINICAL DATA:  Sore throat for 2 days, pain with swallowing, epiglottitis or tonsillitis suspected EXAM: CT NECK WITH CONTRAST TECHNIQUE: Multidetector CT imaging of the neck was performed using the standard protocol following the bolus administration of intravenous contrast. RADIATION DOSE REDUCTION: This exam was performed according to the departmental dose-optimization program which includes automated exposure control, adjustment of the mA and/or kV according to patient size and/or use of iterative reconstruction technique. CONTRAST:  75mL OMNIPAQUE IOHEXOL 300 MG/ML  SOLN COMPARISON:  None Available. FINDINGS: Pharynx and larynx: Hyperenhancement and enlargement of the right greater than left palatine tonsil, with a 1.9 x 1 4 x 1.9 cm low-density collection at the lateral aspect of the right palatine tonsil (series 6, image 44 and series 4, image 73). Nasopharyngeal and oropharyngeal mucosal edema, with prominence of the pharyngeal and lingual tonsils. No thickening of the epiglottis. The larynx unremarkable. Salivary glands: No inflammation, mass, or stone. Thyroid: Normal. Lymph nodes: Enlarged upper cervical lymph nodes, the largest of which includes a 1 4 cm right level 2A lymph node. No abnormal density lymph nodes. Vascular: Patent. Limited intracranial: Negative. Visualized orbits: Negative. Mastoids and visualized paranasal sinuses: Right  maxillary mucous retention cyst. Otherwise clear paranasal sinuses and mastoid air cells. Skeleton: No acute osseous abnormality. Periapical lucency about the roots of several maxillary and mandibular teeth. Upper chest: No focal pulmonary opacity or pleural effusion. IMPRESSION: 1. Tonsillitis with a 1.9 cm right peritonsillar abscess. 2. Enlarged upper cervical lymph nodes, likely reactive. 3. Periapical lucency about the roots of several maxillary and mandibular teeth, which can be seen in the setting of periodontal disease. Dental consultation is recommended. Electronically Signed   By: Wiliam Ke M.D.   On: 11/25/2023 18:39     Data Reviewed: Relevant notes from primary care and specialist visits, past discharge summaries as available in EHR, including Care Everywhere. Prior diagnostic testing as  pertinent to current admission diagnoses Updated medications and problem lists for reconciliation ED course, including vitals, labs, imaging, treatment and response to treatment Triage notes, nursing and pharmacy notes and ED provider's notes Notable results as noted in HPI  Assessment and Plan: No notes have been filed under this hospital service. Service: Hospitalist   Assessment & Plan Tonsillar abscess ENT Dr. Andee Poles has been consulted, will continue with IV antibiotics.  Clear liquid as tolerated n.p.o. after midnight. Essential hypertension Vitals:   11/25/23 2013 11/25/23 2016 11/25/23 2200 11/25/23 2201  BP: (!) 144/108   (!) 167/91  Pulse:   62   Temp:  98.6 F (37 C)    Resp:   18   SpO2:   95%     Alcohol abuse Thiamine 100 g, CIWA protocol, seizure precautions aspiration precautions fall precautions History of seizure Suspect from history of alcohol no trauma reported.  As above Smoking Nicotine patch  DVT prophylaxis:  Heparin x 1   Consults:  ENT:Dr.Vaught   Advance Care Planning:    Code Status: Prior   Family Communication:  None   Disposition Plan:   Home   Severity of Illness: The appropriate patient status for this patient is INPATIENT. Inpatient status is judged to be reasonable and necessary in order to provide the required intensity of service to ensure the patient's safety. The patient's presenting symptoms, physical exam findings, and initial radiographic and laboratory data in the context of their chronic comorbidities is felt to place them at high risk for further clinical deterioration. Furthermore, it is not anticipated that the patient will be medically stable for discharge from the hospital within 2 midnights of admission.   * I certify that at the point of admission it is my clinical judgment that the patient will require inpatient hospital care spanning beyond 2 midnights from the point of admission due to high intensity of service, high risk for further deterioration and high frequency of surveillance required.*  Author: Gertha Calkin, MD 11/25/2023 8:21 PM  For on call review [email or web address].

## 2023-11-25 NOTE — Assessment & Plan Note (Signed)
-  Nicotine patch 

## 2023-11-25 NOTE — Assessment & Plan Note (Signed)
ENT Dr. Andee Poles has been consulted, will continue with IV antibiotics.  Clear liquid as tolerated n.p.o. after midnight.

## 2023-11-25 NOTE — ED Provider Notes (Signed)
[facility] Provider Note    Event Date/Time   First MD Initiated Contact with Patient 11/25/23 1832     (approximate)   History   Sore Throat   HPI  Wesley Doyle is a 32 y.o. male presentz the ER for ration of 3 days of worsening sore throat now having trouble opening his mouth and tolerating p.o. no vomiting.  No shortness of breath.  No recent antibiotics.     Physical Exam   Triage Vital Signs: ED Triage Vitals  Encounter Vitals Group     BP 11/25/23 1616 (!) 177/111     Systolic BP Percentile --      Diastolic BP Percentile --      Pulse Rate 11/25/23 1616 76     Resp 11/25/23 1616 18     Temp 11/25/23 1616 98.3 F (36.8 C)     Temp src --      SpO2 11/25/23 1616 99 %     Weight --      Height --      Head Circumference --      Peak Flow --      Pain Score 11/25/23 1618 7     Pain Loc --      Pain Education --      Exclude from Growth Chart --     Most recent vital signs: Vitals:   11/25/23 1616  BP: (!) 177/111  Pulse: 76  Resp: 18  Temp: 98.3 F (36.8 C)  SpO2: 99%     Constitutional: Alert  Eyes: Conjunctivae are normal.  Head: Atraumatic. Nose: No congestion/rhinnorhea. Mouth/Throat: Mucous membranes are moist.   + Trismus.  No stridor unable to visualize tonsils Neck: Painless ROM.  Cardiovascular:   Good peripheral circulation. Respiratory: Normal respiratory effort.  No retractions.  Gastrointestinal: Soft and nontender.  Musculoskeletal:  no deformity Neurologic:  MAE spontaneously. No gross focal neurologic deficits are appreciated.  Skin:  Skin is warm, dry and intact. No rash noted. Psychiatric: Mood and affect are normal. Speech and behavior are normal.    ED Results / Procedures / Treatments   Labs (all labs ordered are listed, but only abnormal results are displayed) Labs Reviewed  CBC WITH DIFFERENTIAL/PLATELET - Abnormal; Notable for the following components:      Result Value   WBC 16.7 (*)     Neutro Abs 12.3 (*)    Monocytes Absolute 1.7 (*)    All other components within normal limits  BASIC METABOLIC PANEL - Abnormal; Notable for the following components:   Potassium 3.2 (*)    Glucose, Bld 133 (*)    Calcium 8.5 (*)    All other components within normal limits  GROUP A STREP BY PCR     EKG     RADIOLOGY Please see ED Course for my review and interpretation.  I personally reviewed all radiographic images ordered to evaluate for the above acute complaints and reviewed radiology reports and findings.  These findings were personally discussed with the patient.  Please see medical record for radiology report.    PROCEDURES:  Critical Care performed: No  Procedures   MEDICATIONS ORDERED IN ED: Medications  morphine (PF) 4 MG/ML injection 4 mg (4 mg Intravenous Given 11/25/23 1852)  lidocaine (PF) (XYLOCAINE) 4 % (PF) injection 4 mL (has no administration in time range)  Ampicillin-Sulbactam (UNASYN) 3 g in sodium chloride 0.9 % 100 mL IVPB (3 g Intravenous New Bag/Given 11/25/23 1858)  dexamethasone (DECADRON) injection 10 mg (10 mg Intravenous Given 11/25/23 1850)  ketorolac (TORADOL) 15 MG/ML injection 15 mg (15 mg Intravenous Given 11/25/23 1851)  iohexol (OMNIPAQUE) 300 MG/ML solution 75 mL (75 mLs Intravenous Contrast Given 11/25/23 1801)  sodium chloride 0.9 % bolus 1,000 mL (1,000 mLs Intravenous New Bag/Given 11/25/23 1848)     IMPRESSION / MDM / ASSESSMENT AND PLAN / ED COURSE  I reviewed the triage vital signs and the nursing notes.                              Differential diagnosis includes, but is not limited to, PTA, RPA, pharyngitis  Patient presenting to the ER for evaluation of symptoms as described above.  Based on symptoms, risk factors and considered above differential, this presenting complaint could reflect a potentially life-threatening illness therefore the patient will be placed on continuous pulse oximetry and telemetry for  monitoring.  Laboratory evaluation will be sent to evaluate for the above complaints.  Patient with presentation concerning for peritonsillar abscess.  CT imaging ordered triage on my review and interpretation shows evidence of right PTA.  Will give analgesia steroid, antibiotics and see if can visualize for bedside I&D.    Clinical Course as of 11/25/23 1905  Caleen Essex Nov 25, 2023  9528 Despite analgesia patient still with difficulty opening his mouth and given limited visualization I do not feel comfortable and eating at this time.  I discussed the case in consultation with Dr. Andee Poles of ENT who agrees with plan for admission to hospitalist for IV fluids steroids antibiotics symptomatic treatment and reevaluation in the morning.  Plan is to keep patient n.p.o. after midnight. [PR]    Clinical Course User Index [PR] Willy Eddy, MD     FINAL CLINICAL IMPRESSION(S) / ED DIAGNOSES   Final diagnoses:  Peritonsillar abscess     Rx / DC Orders   ED Discharge Orders     None        Note:  This document was prepared using Dragon voice recognition software and may include unintentional dictation errors.    Willy Eddy, MD 11/25/23 [phone]

## 2023-11-25 NOTE — ED Triage Notes (Signed)
Pt comes with c/o sore throat for two days. Pt stats pain when swallowing. Pt denies any fever cough or congestion.

## 2023-11-25 NOTE — Assessment & Plan Note (Signed)
Vitals:   11/25/23 2013 11/25/23 2016 11/25/23 2200 11/25/23 2201  BP: (!) 144/108   (!) 167/91  Pulse:   62   Temp:  98.6 F (37 C)    Resp:   18   SpO2:   95%

## 2023-11-25 NOTE — ED Provider Triage Note (Signed)
Emergency Medicine Provider Triage Evaluation Note  Wesley Doyle , a 32 y.o. male  was evaluated in triage.  Pt complains of sore throat for 2 days.  Review of Systems  Positive: Sore throat, difficulty swallow Negative: fever  Physical Exam  BP (!) 177/111   Pulse 76   Temp 98.3 F (36.8 C)   Resp 18   SpO2 99%  Gen:   Awake, no distress   Resp:  Normal effort  MSK:   Moves extremities without difficulty  Other:  Patient is unable to open his mouth, voice is hoarse, right side of neck is swollen, tender lymphadenopathy, cannot visualize tonsils  Medical Decision Making  Medically screening exam initiated at 5:09 PM.  Appropriate orders placed.  BEHNAM SHAWHAN was informed that the remainder of the evaluation will be completed by another provider, this initial triage assessment does not replace that evaluation, and the importance of remaining in the ED until their evaluation is complete.     Cameron Ali, PA-C 11/25/23 1713

## 2023-11-26 DIAGNOSIS — F1010 Alcohol abuse, uncomplicated: Secondary | ICD-10-CM

## 2023-11-26 DIAGNOSIS — Z87898 Personal history of other specified conditions: Secondary | ICD-10-CM

## 2023-11-26 DIAGNOSIS — I10 Essential (primary) hypertension: Secondary | ICD-10-CM

## 2023-11-26 DIAGNOSIS — J36 Peritonsillar abscess: Secondary | ICD-10-CM | POA: Diagnosis not present

## 2023-11-26 DIAGNOSIS — F17200 Nicotine dependence, unspecified, uncomplicated: Secondary | ICD-10-CM | POA: Diagnosis not present

## 2023-11-26 DIAGNOSIS — E876 Hypokalemia: Secondary | ICD-10-CM | POA: Diagnosis not present

## 2023-11-26 LAB — COMPREHENSIVE METABOLIC PANEL
ALT: 32 U/L (ref 0–44)
AST: 21 U/L (ref 15–41)
Albumin: 3.5 g/dL (ref 3.5–5.0)
Alkaline Phosphatase: 71 U/L (ref 38–126)
Anion gap: 7 (ref 5–15)
BUN: 10 mg/dL (ref 6–20)
CO2: 26 mmol/L (ref 22–32)
Calcium: 9 mg/dL (ref 8.9–10.3)
Chloride: 104 mmol/L (ref 98–111)
Creatinine, Ser: 0.82 mg/dL (ref 0.61–1.24)
GFR, Estimated: >60 mL/min (ref >60)
Glucose, Bld: 209 mg/dL — ABNORMAL HIGH (ref 70–99)
Potassium: 3.5 mmol/L (ref 3.5–5.1)
Sodium: 137 mmol/L (ref 135–145)
Total Bilirubin: 0.6 mg/dL (ref <1.2)
Total Protein: 7.6 g/dL (ref 6.5–8.1)

## 2023-11-26 LAB — HIV ANTIBODY (ROUTINE TESTING W REFLEX): HIV Screen 4th Generation wRfx: NONREACTIVE

## 2023-11-26 LAB — CBC
HCT: 44.8 % (ref 39.0–52.0)
Hemoglobin: 14.9 g/dL (ref 13.0–17.0)
MCH: 27.6 pg (ref 26.0–34.0)
MCHC: 33.3 g/dL (ref 30.0–36.0)
MCV: 83 fL (ref 80.0–100.0)
Platelets: 356 10*3/uL (ref 150–400)
RBC: 5.4 MIL/uL (ref 4.22–5.81)
RDW: 13 % (ref 11.5–15.5)
WBC: 16.9 10*3/uL — ABNORMAL HIGH (ref 4.0–10.5)
nRBC: 0 % (ref 0.0–0.2)

## 2023-11-26 LAB — LACTIC ACID, PLASMA: Lactic Acid, Venous: 1.9 mmol/L (ref 0.5–1.9)

## 2023-11-26 MED ORDER — DEXAMETHASONE SODIUM PHOSPHATE 10 MG/ML IJ SOLN
10.0000 mg | Freq: Two times a day (BID) | INTRAMUSCULAR | Status: DC
  Administered 2023-11-26: 10 mg via INTRAVENOUS
  Filled 2023-11-26: qty 1

## 2023-11-26 MED ORDER — PREDNISONE 10 MG PO TABS: ORAL_TABLET | ORAL | 0 refills | Status: DC

## 2023-11-26 MED ORDER — AMOXICILLIN-POT CLAVULANATE 875-125 MG PO TABS: 1.0000 | ORAL_TABLET | Freq: Two times a day (BID) | ORAL | 0 refills | Status: AC

## 2023-11-26 MED ORDER — SODIUM CHLORIDE 0.9 % IV SOLN
3.0000 g | Freq: Four times a day (QID) | INTRAVENOUS | Status: DC
Start: 2023-11-26 — End: 2023-11-26
  Administered 2023-11-26: 3 g via INTRAVENOUS
  Filled 2023-11-26: qty 8

## 2023-11-26 NOTE — ED Notes (Addendum)
Last meal at 00:10, permission to feed patient obtained from Allena Katz, MD

## 2023-11-26 NOTE — Discharge Summary (Signed)
Physician Discharge Summary   Patient: Wesley Doyle MRN: [id number] DOB: 1991/09/06  Admit date:     11/25/2023  Discharge date: 11/26/23  Discharge Physician: [name]   PCP: Pcp, No   Recommendations at discharge:   Follow-up with ENT Dr. Andee Poles [facility]  Discharge Diagnoses: Principal Problem:   Tonsillar abscess Active Problems:   Essential hypertension   Alcohol abuse   History of seizure   Smoking   Hypokalemia    Hospital Course: 32 year old man past medical history of alcohol abuse and tobacco abuse presented with sore throat for couple days.  CT scan commenting on tonsillitis with 1.9 cm right peritonsillar abscess.  Patient given a dose of Unasyn and Decadron in the ER  12/7.  Seen by ENT Dr. Andee Poles and recommended discharge home with Augmentin and prednisone.  He can follow-up with him this week.  Patient feeling much better.    Assessment and Plan: * Tonsillar abscess Seen by ENT Dr. For Tammy does not believe this is an abscess and believes it is more of a phlegmon.  Okay with discharge and following up next week on Augmentin and prednisone.  Essential hypertension Patient's blood pressure upon discharge 127/68 without medications.  Follow-up as outpatient.   Alcohol abuse Advised against alcohol use  History of seizure No recent seizure.  Hypokalemia Improved  Smoking .         Consultants: ENT Procedures performed: None Disposition: Home Diet recommendation:  Regular diet DISCHARGE MEDICATION: Allergies as of 11/26/2023   No Known Allergies      Medication List     TAKE these medications    amoxicillin-clavulanate 875-125 MG tablet Commonly known as: AUGMENTIN Take 1 tablet by mouth 2 (two) times daily for 10 days.   predniSONE 10 MG tablet Commonly known as: DELTASONE 4 tabs po day1,2; 3 tabs po day3,4; 2 tabs po day5,6; 1 tab po day7,8' 1/2 tab po day9,10 Start taking on: November 27, 2023         Follow-up Information     Vaught, Roney Mans, MD Follow up.   Specialty: Otolaryngology Why: this week for hospital follow up Contact information: [address] [place] [address] [place] [address] [phone]         [facility] Follow up in 3 week(s).   Specialty: Primary Care Why: set up medical care Contact information: [address] [place] [address] [phone]               Discharge Exam: Physical Exam HENT:     Head: Normocephalic.     Mouth/Throat:     Pharynx: No oropharyngeal exudate.  Eyes:     General: Lids are normal.     Conjunctiva/sclera: Conjunctivae normal.  Cardiovascular:     Rate and Rhythm: Normal rate and regular rhythm.     Heart sounds: Normal heart sounds, S1 normal and S2 normal.  Pulmonary:     Breath sounds: No decreased breath sounds, wheezing, rhonchi or rales.  Abdominal:     Palpations: Abdomen is soft.     Tenderness: There is no abdominal tenderness.  Musculoskeletal:     Right ankle: No swelling.     Left ankle: No swelling.  Lymphadenopathy:     Cervical: Cervical adenopathy present.  Skin:    General: Skin is warm.     Findings: No rash.  Neurological:     Mental Status: He is alert.  Comments: Answers all questions appropriately.      Condition at discharge: stable  The results of significant diagnostics from this hospitalization (including imaging, microbiology, ancillary and laboratory) are listed below for reference.   Imaging Studies: CT Soft Tissue Neck W Contrast  Result Date: 11/25/2023 CLINICAL DATA:  Sore throat for 2 days, pain with swallowing, epiglottitis or tonsillitis suspected EXAM: CT NECK WITH CONTRAST TECHNIQUE: Multidetector CT imaging of the neck was performed using the standard protocol following the bolus administration of intravenous contrast. RADIATION DOSE REDUCTION: This exam was performed according to the departmental  dose-optimization program which includes automated exposure control, adjustment of the mA and/or kV according to patient size and/or use of iterative reconstruction technique. CONTRAST:  75mL OMNIPAQUE IOHEXOL 300 MG/ML  SOLN COMPARISON:  None Available. FINDINGS: Pharynx and larynx: Hyperenhancement and enlargement of the right greater than left palatine tonsil, with a 1.9 x 1 4 x 1.9 cm low-density collection at the lateral aspect of the right palatine tonsil (series 6, image 44 and series 4, image 73). Nasopharyngeal and oropharyngeal mucosal edema, with prominence of the pharyngeal and lingual tonsils. No thickening of the epiglottis. The larynx unremarkable. Salivary glands: No inflammation, mass, or stone. Thyroid: Normal. Lymph nodes: Enlarged upper cervical lymph nodes, the largest of which includes a 1 4 cm right level 2A lymph node. No abnormal density lymph nodes. Vascular: Patent. Limited intracranial: Negative. Visualized orbits: Negative. Mastoids and visualized paranasal sinuses: Right maxillary mucous retention cyst. Otherwise clear paranasal sinuses and mastoid air cells. Skeleton: No acute osseous abnormality. Periapical lucency about the roots of several maxillary and mandibular teeth. Upper chest: No focal pulmonary opacity or pleural effusion. IMPRESSION: 1. Tonsillitis with a 1.9 cm right peritonsillar abscess. 2. Enlarged upper cervical lymph nodes, likely reactive. 3. Periapical lucency about the roots of several maxillary and mandibular teeth, which can be seen in the setting of periodontal disease. Dental consultation is recommended. Electronically Signed   By: Wiliam Ke M.D.   On: 11/25/2023 18:39    Microbiology: Results for orders placed or performed during the hospital encounter of 11/25/23  Group A Strep by PCR     Status: None   Collection Time: 11/25/23  4:19 PM   Specimen: Throat; Sterile Swab  Result Value Ref Range Status   Group A Strep by PCR NOT DETECTED NOT  DETECTED Final    Comment: Performed at [facility], [address] [place], [place] [address]    Labs: CBC: Recent Labs  Lab 11/25/23 1721 11/26/23 0545  WBC 16.7* 16.9*  NEUTROABS 12.3*  --   HGB 14.9 14.9  HCT 44.3 44.8  MCV 81.6 83.0  PLT 342 356   Basic Metabolic Panel: Recent Labs  Lab 11/25/23 1721 11/26/23 0545  NA 136 137  K 3.2* 3.5  CL 102 104  CO2 28 26  GLUCOSE 133* 209*  BUN 10 10  CREATININE 0.61 0.82  CALCIUM 8.5* 9.0   Liver Function Tests: Recent Labs  Lab 11/25/23 1721 11/26/23 0545  AST 23 21  ALT 33 32  ALKPHOS 64 71  BILITOT 0.4 0.6  PROT 7.4 7.6  ALBUMIN 3.6 3.5   CBG: No results for input(s): "GLUCAP" in the last 168 hours.  Discharge time spent: greater than 30 minutes.  Signed: [name], MD [facility] 11/26/2023

## 2023-11-26 NOTE — Assessment & Plan Note (Signed)
No recent seizure.

## 2023-11-26 NOTE — Assessment & Plan Note (Signed)
Seen by ENT Dr. For Wesley Doyle does not believe this is an abscess and believes it is more of a phlegmon.  Okay with discharge and following up next week on Augmentin and prednisone.

## 2023-11-26 NOTE — Assessment & Plan Note (Addendum)
Patient's blood pressure upon discharge 127/68 without medications.  Follow-up as outpatient.

## 2023-11-26 NOTE — Progress Notes (Signed)
Pharmacy Antibiotic Note  Wesley Doyle is a 32 y.o. male admitted on 11/25/2023 with Tonsillar abscess.  Pharmacy has been consulted for Unasyn dosing.  Plan: start Unasyn 3 grams IV every 6 hours ---follow renal function for needed dose adjustments     Temp (24hrs), Avg:98.4 F (36.9 C), Min:98.2 F (36.8 C), Max:98.6 F (37 C)  Recent Labs  Lab 11/25/23 1721 11/25/23 2159 11/26/23 0545  WBC 16.7*  --  16.9*  CREATININE 0.61  --  0.82  LATICACIDVEN  --  1.7  --     CrCl cannot be calculated (Unknown ideal weight.).    No Known Allergies  Antimicrobials this admission: 12/07 Unasyn >>   Thank you for allowing pharmacy to be a part of this patient's care.  Lowella Bandy 11/26/2023 7:38 AM

## 2023-11-26 NOTE — Hospital Course (Signed)
32 year old man past medical history of alcohol abuse and tobacco abuse presented with sore throat for couple days.  CT scan commenting on tonsillitis with 1.9 cm right peritonsillar abscess.  Patient given a dose of Unasyn and Decadron in the ER  12/7.  Seen by ENT Dr. Andee Poles and recommended discharge home with Augmentin and prednisone.  He can follow-up with him this week.  Patient feeling much better.

## 2023-11-26 NOTE — Final Consult Note (Signed)
Wesley Doyle, Wesley Doyle [id number] 1991/10/22 [name], MD  Reason for Consult: tonsillitis, possible peri-tonsillar abscess  HPI: 32 year old male admitted from ED on 12/6 with a 2 day history of progressively worsening sore throat.  Found to have trismus and neck pain and odynophagia on exam with limited ability to tolerate secretions.  Admitted for IV antibiotics and steroids and possible I&D.  Patient reports significant improvement overnight.  Sleeping swell.  Resolution of neck pain.  No breathing difficulty.  Reports he is almost back to normal.  Allergies: No Known Allergies  ROS: Review of systems normal other than 12 systems except per HPI.  PMH:  Past Medical History:  Diagnosis Date   Alcohol abuse 03/03/2020   Hypertension     FH:  Family History  Problem Relation Age of Onset   Other Mother        murdered   Diabetes Father    Hypertension Maternal Grandmother    Diabetes Maternal Grandmother    Other Maternal Grandfather        unknown medical history   Other Paternal Grandmother        unknown medical history   Other Paternal Grandfather        unknown medical history    SH:  Social History   Socioeconomic History   Marital status: Single    Spouse name: Not on file   Number of children: 1   Years of education: Not on file   Highest education level: Not on file  Occupational History   Not on file  Tobacco Use   Smoking status: Every Day    Current packs/day: 0.50    Average packs/day: 0.5 packs/day for 15.0 years (7.5 ttl pk-yrs)    Types: Cigarettes   Smokeless tobacco: Never   Tobacco comments:    07/29/22 decreased to 1/5th of liquor a week  Vaping Use   Vaping status: Never Used  Substance and Sexual Activity   Alcohol use: Yes    Comment: 1/5 of liquor 3 times per week   Drug use: Yes    Frequency: 3.0 times per week    Types: Marijuana    Comment: last use 07/2022   Sexual activity: Not Currently  Other Topics Concern   Not on file   Social History Narrative   [facility] for [facility]x of alcohol abuse.   Social Determinants of Health   Financial Resource Strain: Not on file  Food Insecurity: No Food Insecurity (03/09/2022)   Hunger Vital Sign    Worried About Running Out of Food in the Last Year: Never true    Ran Out of Food in the Last Year: Never true  Transportation Needs: No Transportation Needs (03/09/2022)   PRAPARE - Administrator, Civil Service (Medical): No    Lack of Transportation (Non-Medical): No  Physical Activity: Not on file  Stress: Not on file  Social Connections: Not on file  Intimate Partner Violence: Not on file    PSH:  Past Surgical History:  Procedure Laterality Date   NO PAST SURGERIES      Physical  Exam:  GEN-  supine in bed sleeping NEURO- CN 2-12 grossly intact and symmetric. EARS- clear bilaterally NOSE-  clear anteriorly OC/OP-  mallampati 3 but no fluctuance or fullness adjacent to right tonsil.  Edematous tonsils and erythema NECK-  supple with reactive lymph nodes. RESP- unlabored CARD-  RRR  CT-  Bilateral tonsillar edema. Hypodensity in right tonsil  and adjacent approximately 2 cm in largest dimention   A/P: Tonsillitis with more phlegmon on CT on my read and on exam.  Only 2 days of symptoms would support that.  Given dramatic improvement, resolution of trismus, resolution of pain, tolerating PO recommend continued conservative management.  OK for PO this morning and if tolerates as expected, OK for discharge.  Augmentin and Prednisone.  I will be happy to see pain later this next week for follow up.   Roney Mans Johanny Segers 11/26/2023 8:28 AM

## 2023-11-26 NOTE — Assessment & Plan Note (Signed)
Advised against alcohol use

## 2023-11-26 NOTE — Assessment & Plan Note (Signed)
Improved

## 2023-11-28 LAB — HIGH SENSITIVITY CRP: CRP, High Sensitivity: 47.88 mg/L — ABNORMAL HIGH (ref 0.00–3.00)

## 2023-12-30 ENCOUNTER — Other Ambulatory Visit: Payer: Self-pay

## 2023-12-30 ENCOUNTER — Emergency Department
Admission: EM | Admit: 2023-12-30 | Discharge: 2023-12-30 | Disposition: A | Discharge status: Home / Self Care | Payer: 59 | Attending: Emergency Medicine | Admitting: Emergency Medicine

## 2023-12-30 DIAGNOSIS — R300 Dysuria: Secondary | ICD-10-CM | POA: Diagnosis not present

## 2023-12-30 LAB — URINALYSIS, ROUTINE W REFLEX MICROSCOPIC
Bacteria, UA: NONE SEEN
Bilirubin Urine: NEGATIVE
Glucose, UA: NEGATIVE mg/dL
Ketones, ur: NEGATIVE mg/dL
Leukocytes,Ua: NEGATIVE
Nitrite: NEGATIVE
Protein, ur: NEGATIVE mg/dL
Specific Gravity, Urine: 1.023 (ref 1.005–1.030)
pH: 5 (ref 5.0–8.0)

## 2023-12-30 LAB — CHLAMYDIA/NGC RT PCR (ARMC ONLY)
Chlamydia Tr: NOT DETECTED
N gonorrhoeae: NOT DETECTED

## 2023-12-30 NOTE — ED Triage Notes (Signed)
 Pt to ED for burning with urination started 2 days ago.

## 2023-12-30 NOTE — ED Notes (Signed)
 See triage note  Presents with some burning with urination  States he noticed this about 2 days ago  Denies any discharge   Afebrile on arrival

## 2023-12-30 NOTE — ED Provider Notes (Signed)
 [facility] Provider Note    Event Date/Time   First MD Initiated Contact with Patient 12/30/23 [phone]     (approximate)   History   No chief complaint on file.   HPI  Wesley Doyle is a 33 y.o. male who presents to the ED for evaluation of No chief complaint on file.   Review of medical DC summary from 1 month ago.  History of ethanol and tobacco abuse.  Admitted for PTA. Antibiotics without I&D.   Patient presents with 1 day of dysuria.  No abdominal pain, emesis, fever.  Denies any penile or scrotal rashes or swelling.  No trauma to the area.  No pain without urination.  No urethral discharge.   Physical Exam   Triage Vital Signs: ED Triage Vitals  Encounter Vitals Group     BP 12/30/23 0710 (!) 165/109     Systolic BP Percentile --      Diastolic BP Percentile --      Pulse Rate 12/30/23 0708 99     Resp 12/30/23 0708 17     Temp 12/30/23 0708 97.9 F (36.6 C)     Temp src --      SpO2 12/30/23 0708 96 %     Weight 12/30/23 0708 200 lb (90.7 kg)     Height 12/30/23 0708 5' 8 (1.727 m)     Head Circumference --      Peak Flow --      Pain Score 12/30/23 0708 7     Pain Loc --      Pain Education --      Exclude from Growth Chart --     Most recent vital signs: Vitals:   12/30/23 0708 12/30/23 0710  BP:  (!) 165/109  Pulse: 99   Resp: 17   Temp: 97.9 F (36.6 C)   SpO2: 96%     General: Awake, no distress.  CV:  Good peripheral perfusion.  Resp:  Normal effort.  Abd:  No distention.  Soft and benign MSK:  No deformity noted.  Neuro:  No focal deficits appreciated. Other:  GU exam deferred by the patient   ED Results / Procedures / Treatments   Labs (all labs ordered are listed, but only abnormal results are displayed) Labs Reviewed  URINALYSIS, ROUTINE W REFLEX MICROSCOPIC - Abnormal; Notable for the following components:      Result Value   Color, Urine YELLOW (*)    APPearance CLEAR (*)    Hgb urine dipstick  MODERATE (*)    All other components within normal limits  CHLAMYDIA/NGC RT PCR ([facility] ONLY)            URINE CULTURE    EKG   RADIOLOGY   Official radiology report(s): No results found.  PROCEDURES and INTERVENTIONS:  Procedures  Medications - No data to display   IMPRESSION / MDM / ASSESSMENT AND PLAN / ED COURSE  I reviewed the triage vital signs and the nursing notes.  Differential diagnosis includes, but is not limited to, cystitis, gonorrhea, chlamydia  {Patient presents with symptoms of an acute illness or injury that is potentially life-threatening.  Patient presents with 1 day of dysuria.  Nontender abdomen.  UA without infectious features.  We will send this for a culture and add on gonorrhea/chlamydia.  Clinical Course as of 12/30/23 9056  [name] Dec 30, 2023  0729 Appearance(!): CLEAR [JW]  [phone] After gonorrhea and Chlamydia test returned negative I reassessed the  patient.  Clarify symptoms.  Reports he does not actually have dysuria but makes girlfriend told his current girlfriend that he might be exposed to hep C.  He denies any actual symptoms.  I told him about the local health department and their ability to perform screening tests on asymptomatic patients [DS]    Clinical Course User Index [DS] Claudene Rover, MD [JW] [name]     FINAL CLINICAL IMPRESSION(S) / ED DIAGNOSES   Final diagnoses:  Dysuria     Rx / DC Orders   ED Discharge Orders     None        Note:  This document was prepared using Dragon voice recognition software and may include unintentional dictation errors.   Claudene Rover, MD 12/30/23 [phone]

## 2023-12-31 LAB — URINE CULTURE: Culture: <10000 — AB

## 2024-03-26 ENCOUNTER — Other Ambulatory Visit: Payer: Self-pay

## 2024-03-26 ENCOUNTER — Encounter: Payer: Self-pay | Admitting: *Deleted

## 2024-03-26 ENCOUNTER — Emergency Department: Payer: Self-pay

## 2024-03-26 DIAGNOSIS — W010XXA Fall on same level from slipping, tripping and stumbling without subsequent striking against object, initial encounter: Secondary | ICD-10-CM | POA: Insufficient documentation

## 2024-03-26 DIAGNOSIS — M25511 Pain in right shoulder: Secondary | ICD-10-CM | POA: Insufficient documentation

## 2024-03-26 DIAGNOSIS — Z5321 Procedure and treatment not carried out due to patient leaving prior to being seen by health care provider: Secondary | ICD-10-CM | POA: Insufficient documentation

## 2024-03-26 NOTE — ED Triage Notes (Signed)
 Pt ambulatory to triage.  Pt reports he tripped and fell.  Pt has right shoulder pain.  Limited rom  pt alert.

## 2024-03-27 ENCOUNTER — Emergency Department
Admission: EM | Admit: 2024-03-27 | Discharge: 2024-03-27 | Discharge status: Against Medical Advice | Payer: Self-pay | Attending: Emergency Medicine | Admitting: Emergency Medicine

## 2024-03-27 NOTE — ED Notes (Signed)
 No answer when called several times from lobby
